# Patient Record
Sex: Female | Born: 1935 | Race: White | Hispanic: No | Marital: Married | Smoking: Current every day smoker
Health system: Southern US, Community
[De-identification: ages and names within clinical notes are randomized; demographics above are authoritative.]

## PROBLEM LIST (undated history)

## (undated) DIAGNOSIS — M545 Low back pain, unspecified: Secondary | ICD-10-CM

## (undated) DIAGNOSIS — G8929 Other chronic pain: Secondary | ICD-10-CM

## (undated) DIAGNOSIS — N289 Disorder of kidney and ureter, unspecified: Secondary | ICD-10-CM

## (undated) DIAGNOSIS — E119 Type 2 diabetes mellitus without complications: Secondary | ICD-10-CM

## (undated) HISTORY — PX: JOINT REPLACEMENT: SHX530

## (undated) HISTORY — PX: TONSILLECTOMY: SUR1361

---

## 2004-11-29 ENCOUNTER — Ambulatory Visit: Payer: Self-pay

## 2006-01-25 ENCOUNTER — Ambulatory Visit: Payer: Self-pay | Admitting: Family Medicine

## 2007-01-29 ENCOUNTER — Ambulatory Visit: Payer: Self-pay | Admitting: Family Medicine

## 2007-06-19 ENCOUNTER — Ambulatory Visit: Payer: Self-pay | Admitting: Nephrology

## 2007-09-12 ENCOUNTER — Ambulatory Visit: Payer: Self-pay | Admitting: Family Medicine

## 2007-09-17 ENCOUNTER — Ambulatory Visit: Payer: Self-pay | Admitting: Family Medicine

## 2007-11-25 ENCOUNTER — Inpatient Hospital Stay: Payer: Self-pay | Admitting: Internal Medicine

## 2007-11-29 ENCOUNTER — Encounter: Payer: Self-pay | Admitting: Internal Medicine

## 2009-02-03 ENCOUNTER — Ambulatory Visit: Payer: Self-pay | Admitting: Internal Medicine

## 2009-12-21 ENCOUNTER — Emergency Department: Payer: Self-pay | Admitting: Emergency Medicine

## 2009-12-24 ENCOUNTER — Emergency Department: Payer: Self-pay | Admitting: Emergency Medicine

## 2010-01-04 ENCOUNTER — Inpatient Hospital Stay: Payer: Self-pay | Admitting: General Practice

## 2010-04-20 ENCOUNTER — Emergency Department: Payer: Self-pay | Admitting: Emergency Medicine

## 2010-04-21 ENCOUNTER — Emergency Department: Payer: Self-pay | Admitting: Internal Medicine

## 2010-05-17 ENCOUNTER — Inpatient Hospital Stay: Payer: Self-pay | Admitting: Specialist

## 2010-05-20 ENCOUNTER — Encounter: Payer: Self-pay | Admitting: Internal Medicine

## 2010-05-27 ENCOUNTER — Ambulatory Visit: Payer: Self-pay | Admitting: Internal Medicine

## 2010-05-29 ENCOUNTER — Emergency Department: Payer: Self-pay | Admitting: Emergency Medicine

## 2010-05-30 ENCOUNTER — Encounter: Payer: Self-pay | Admitting: Internal Medicine

## 2010-06-03 ENCOUNTER — Ambulatory Visit: Payer: Self-pay | Admitting: Internal Medicine

## 2010-06-08 ENCOUNTER — Other Ambulatory Visit: Payer: Self-pay | Admitting: Orthopedic Surgery

## 2010-06-08 DIAGNOSIS — M545 Low back pain: Secondary | ICD-10-CM

## 2010-06-08 DIAGNOSIS — M4800 Spinal stenosis, site unspecified: Secondary | ICD-10-CM

## 2010-06-08 DIAGNOSIS — M79605 Pain in left leg: Secondary | ICD-10-CM

## 2010-06-09 ENCOUNTER — Ambulatory Visit
Admission: RE | Admit: 2010-06-09 | Discharge: 2010-06-09 | Disposition: A | Discharge status: Home / Self Care | Payer: Medicare Other | Source: Ambulatory Visit | Attending: Orthopedic Surgery | Admitting: Orthopedic Surgery

## 2010-06-09 ENCOUNTER — Other Ambulatory Visit: Payer: Self-pay | Admitting: Orthopedic Surgery

## 2010-06-09 ENCOUNTER — Ambulatory Visit: Payer: Medicare Other

## 2010-06-09 DIAGNOSIS — M79605 Pain in left leg: Secondary | ICD-10-CM

## 2010-06-09 DIAGNOSIS — M4800 Spinal stenosis, site unspecified: Secondary | ICD-10-CM

## 2010-06-09 DIAGNOSIS — M545 Low back pain: Secondary | ICD-10-CM

## 2010-06-13 ENCOUNTER — Inpatient Hospital Stay: Payer: Self-pay | Admitting: Unknown Physician Specialty

## 2010-07-22 ENCOUNTER — Ambulatory Visit: Payer: Self-pay | Admitting: Unknown Physician Specialty

## 2010-07-31 ENCOUNTER — Inpatient Hospital Stay: Payer: Self-pay | Admitting: Internal Medicine

## 2010-12-19 ENCOUNTER — Ambulatory Visit: Payer: Self-pay | Admitting: Ophthalmology

## 2011-01-10 ENCOUNTER — Ambulatory Visit: Payer: Self-pay | Admitting: Neurosurgery

## 2011-12-29 IMAGING — CT CT OF THE RIGHT HIP WITHOUT CONTRAST
1 series · 16 of 32 positions shown, 20 images · non-contrast
Comparison: none

REASON FOR EXAM: pain, non-weight bearing, Hx ipsilateral pelvic fx
COMMENTS:

PROCEDURE:     CT  - CT HIP RIGHT WITHOUT CONTRAST  - December 24, 2009  [DATE]
RESULT:     History: Pain.
Comparison Study: No recent.

[Series 2: hip 3.0 b70s · axial · 0.38mm/px · z∈[-488,-300]mm · 16 of 71 slices shown, 20 images]
[im 5/71  soft-tissue]
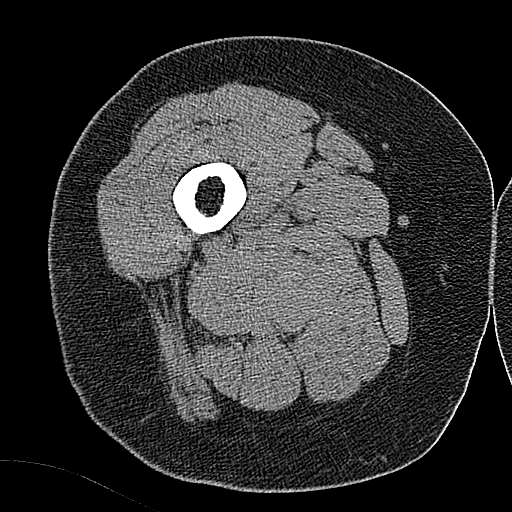
[im 5/71  bone]
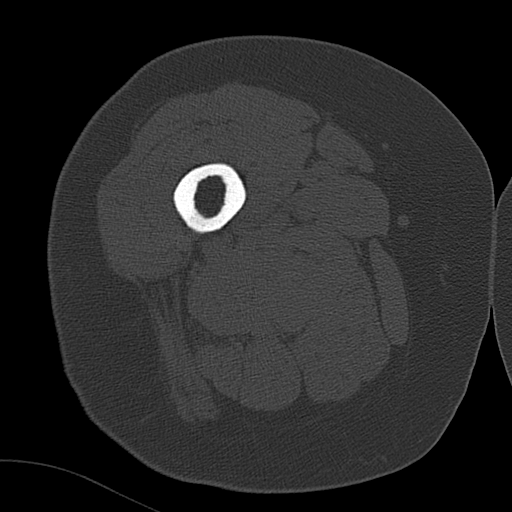
[im 10/71  soft-tissue]
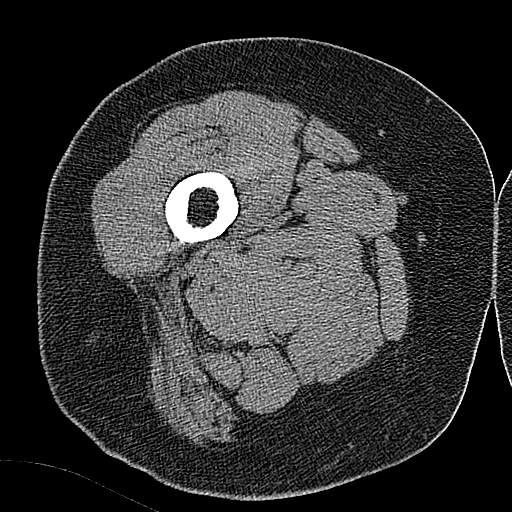
[im 14/71  soft-tissue]
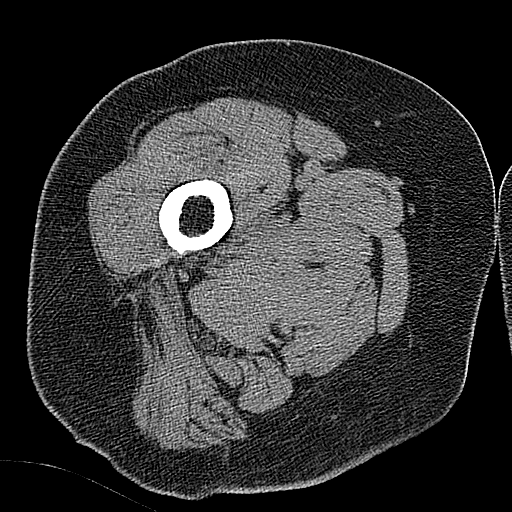
[im 19/71  soft-tissue]
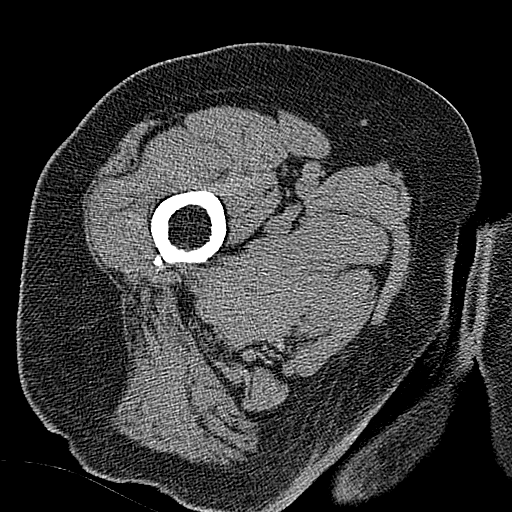
[im 23/71  soft-tissue]
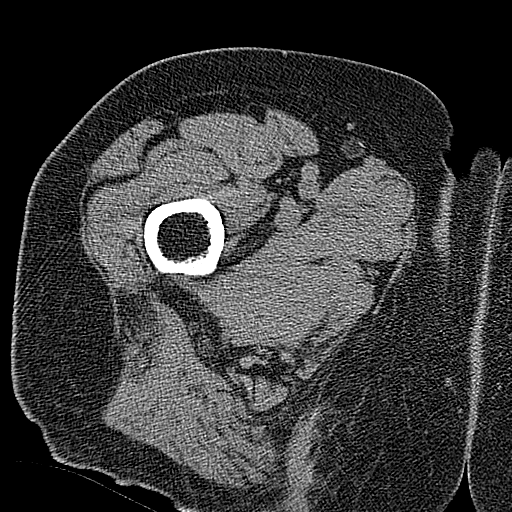
[im 28/71  soft-tissue]
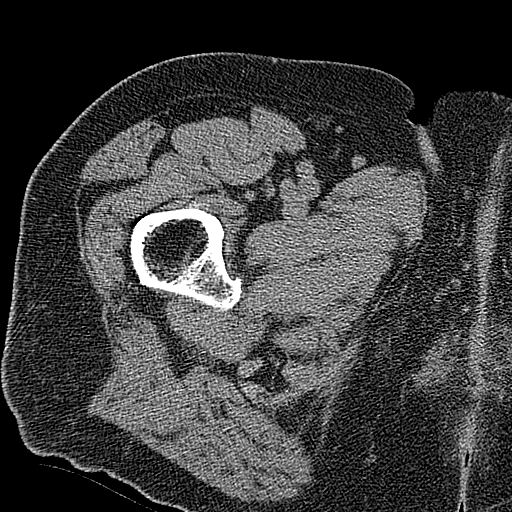
[im 32/71  soft-tissue]
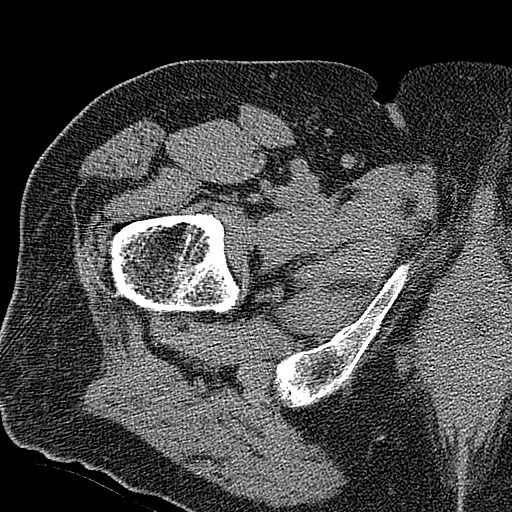
[im 39/71  soft-tissue]
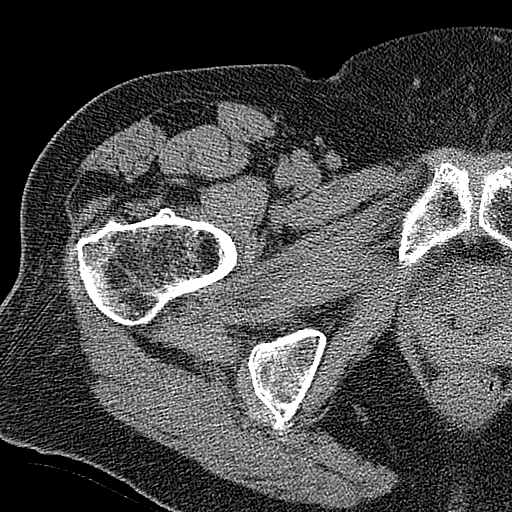
[im 43/71  soft-tissue]
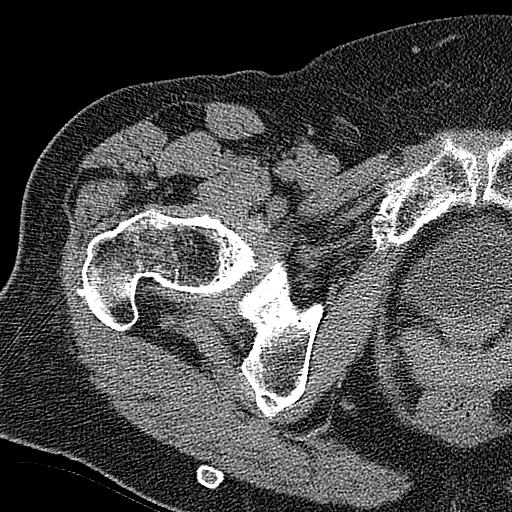
[im 43/71  bone]
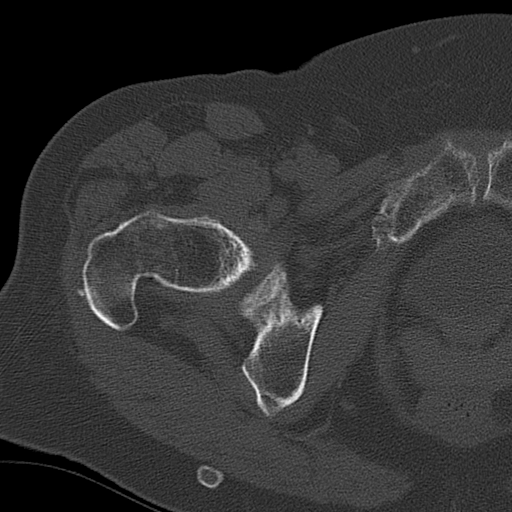
[im 48/71  soft-tissue]
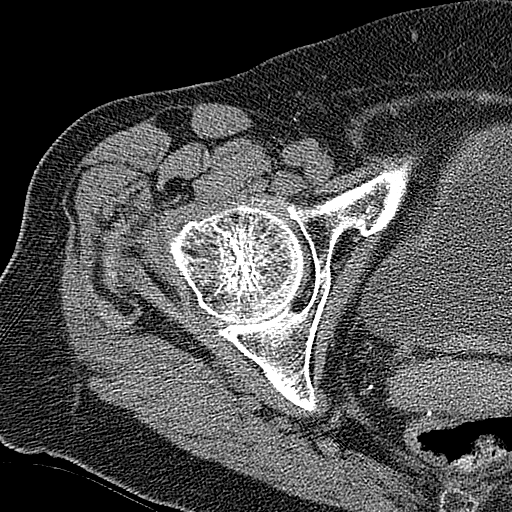
[im 52/71  soft-tissue]
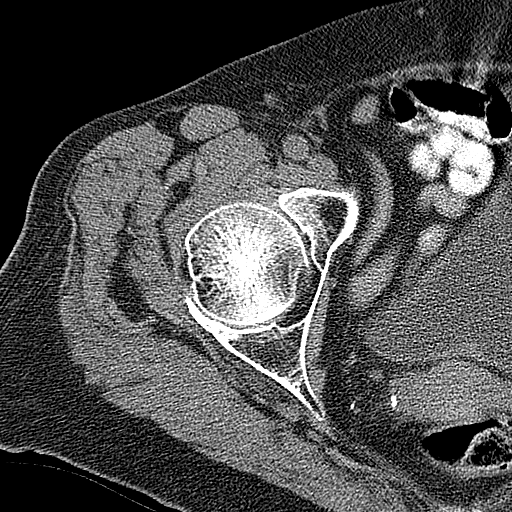
[im 57/71  soft-tissue]
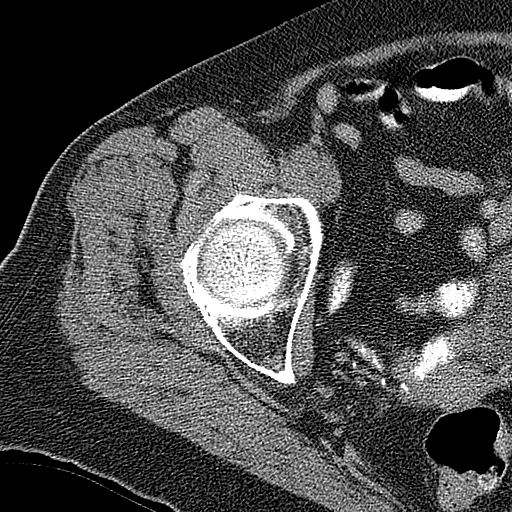
[im 61/71  soft-tissue]
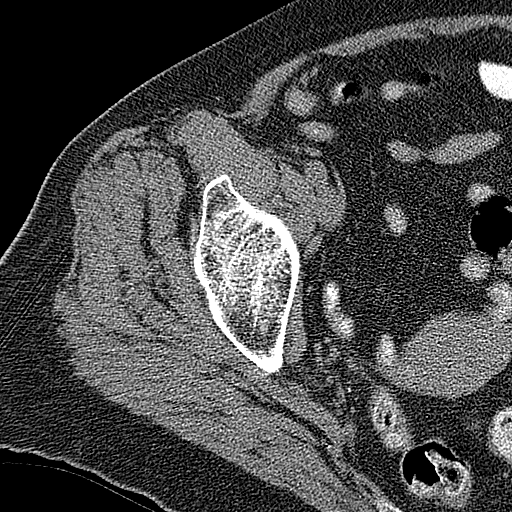
[im 61/71  lung]
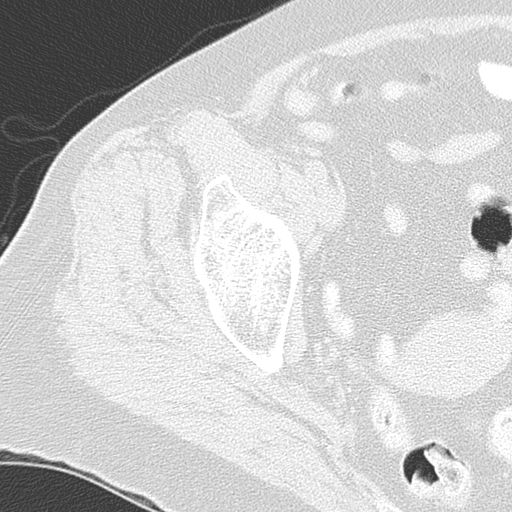
[im 64/71  lung]
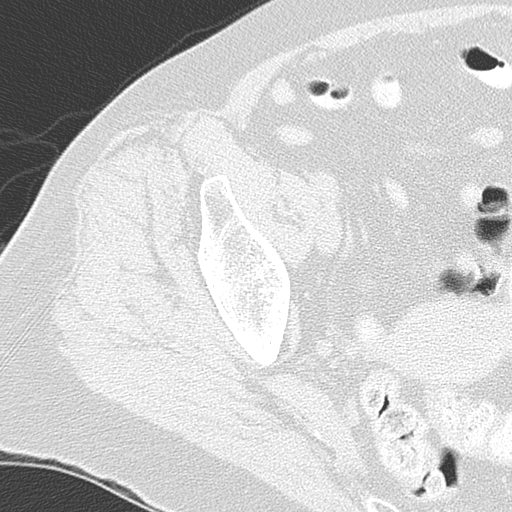
[im 66/71  soft-tissue]
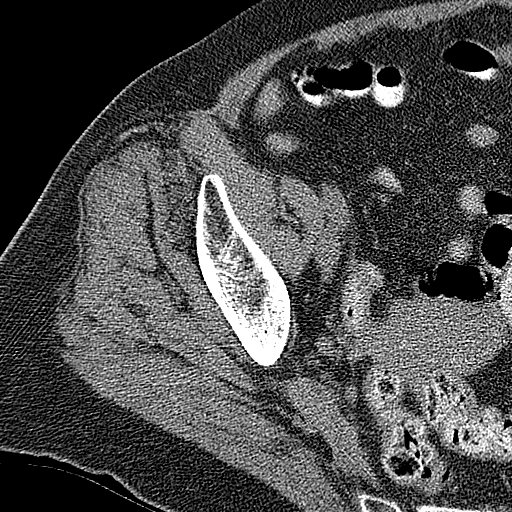
[im 66/71  lung]
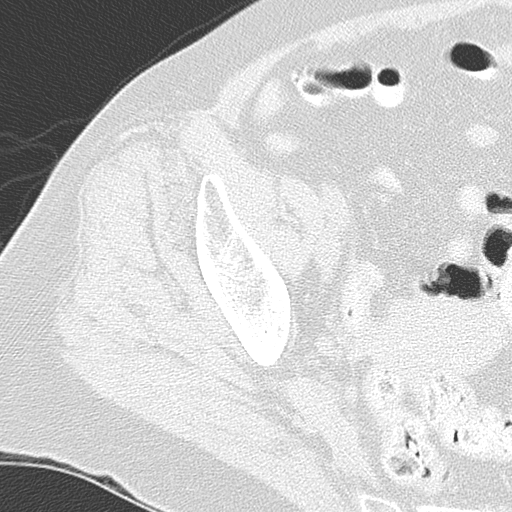
[im 68/71  lung]
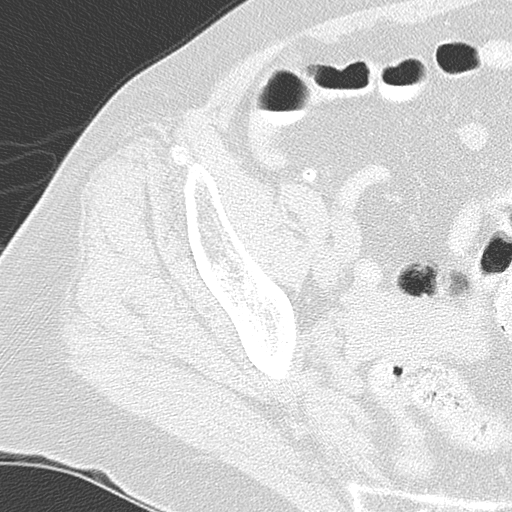

[16 of 32 positions shown; findings below may reference images not displayed]

FINDINGS: Standard CT obtained. Degenerative change present. There is no
evidence of fracture or dislocation.
IMPRESSION: No acute abnormality.

## 2012-02-21 ENCOUNTER — Ambulatory Visit: Payer: Self-pay | Admitting: Gastroenterology

## 2012-02-22 LAB — PATHOLOGY REPORT

## 2012-02-29 ENCOUNTER — Ambulatory Visit: Payer: Self-pay | Admitting: Internal Medicine

## 2012-05-31 IMAGING — CR DG LUMBAR SPINE AP/LAT/OBLIQUES W/ FLEX AND EXT
1 series · 5 of 5 positions shown · non-contrast
Comparison: none

REASON FOR EXAM: Back Pain
COMMENTS:

[Series 1: view not recorded · 0.17mm/px · 5 of 5 slices shown]
[im 1/5]
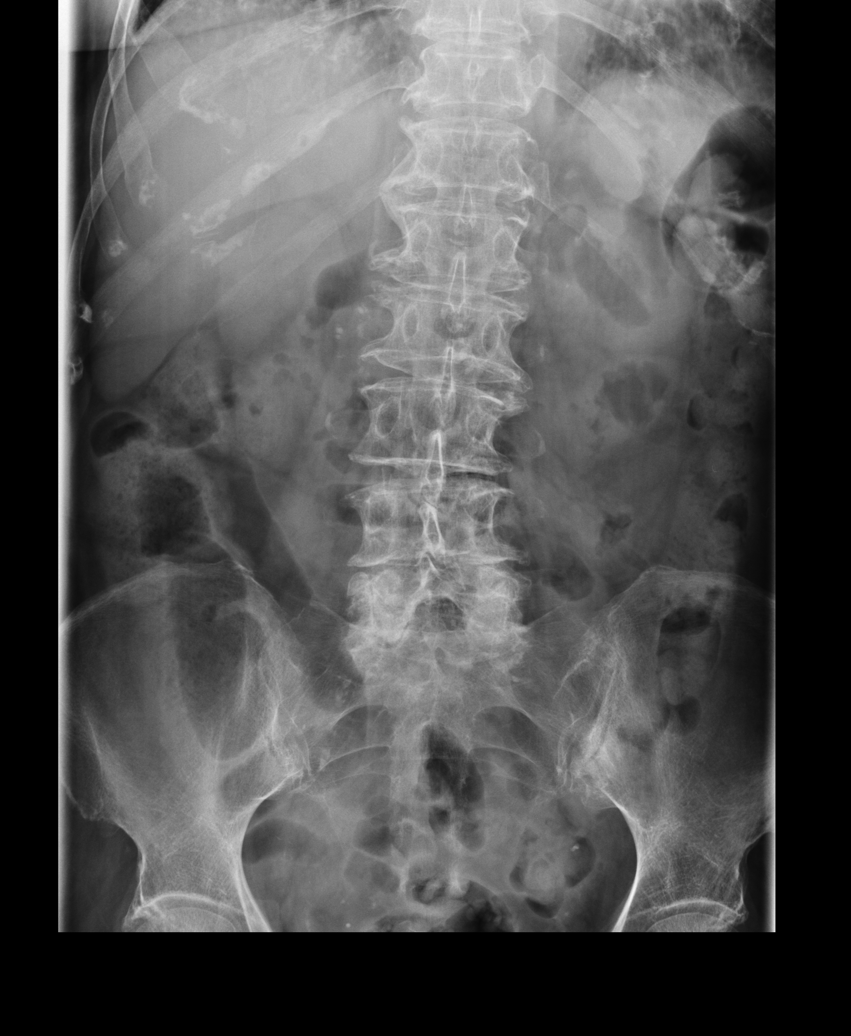
[im 2/5]
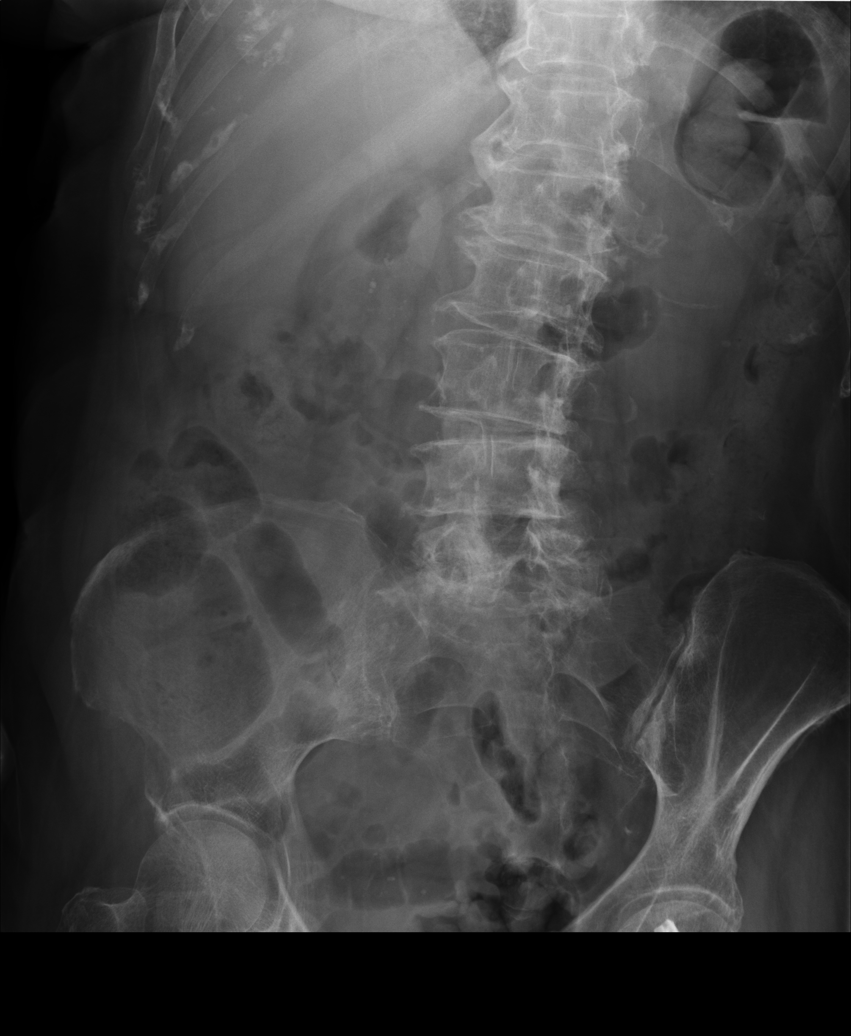
[im 3/5]
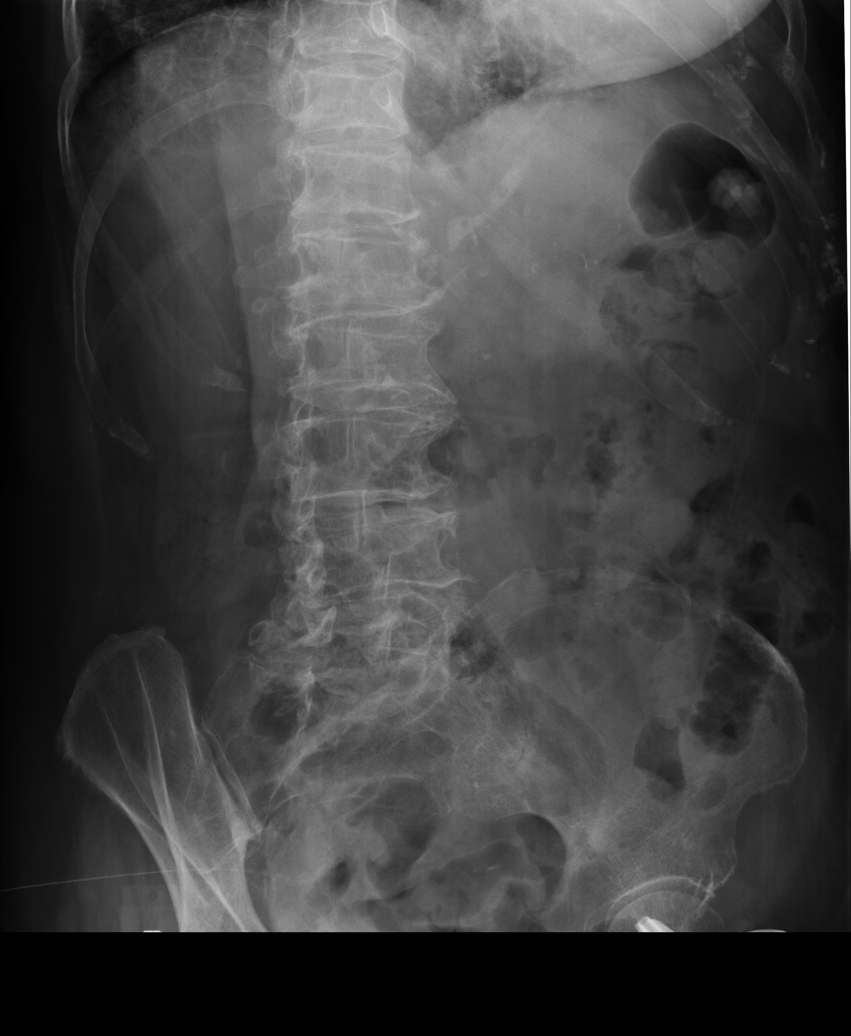
[im 4/5]
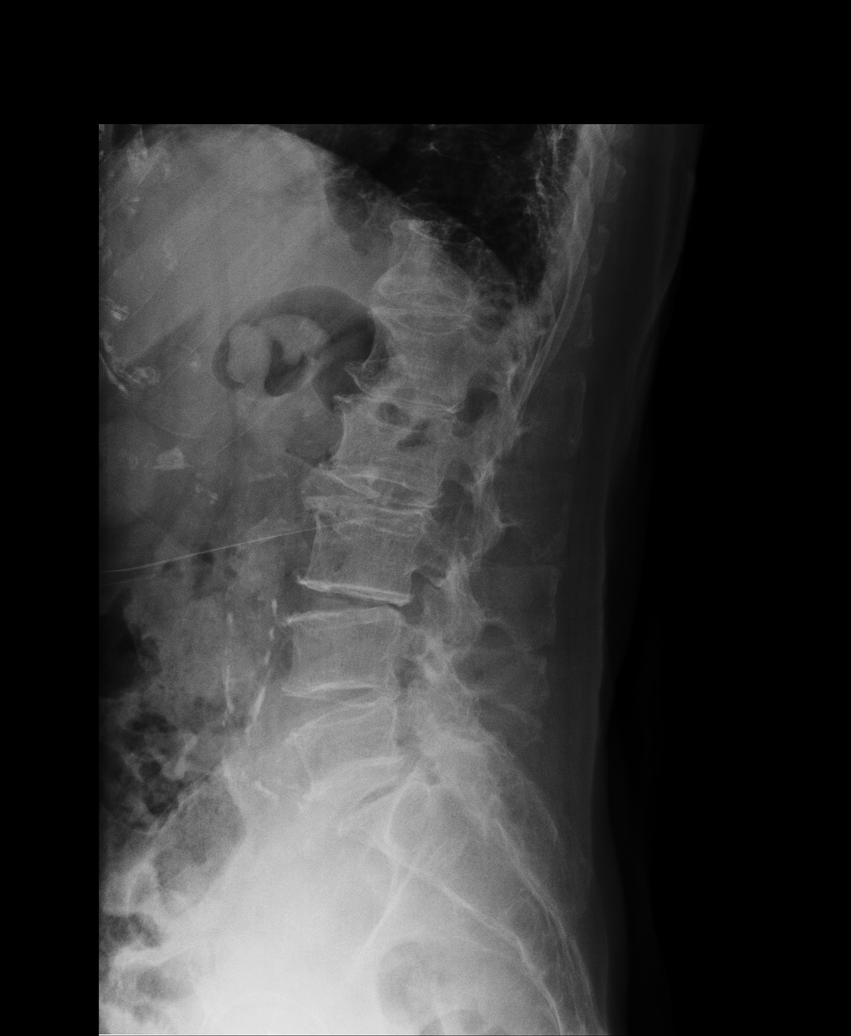
[im 5/5]
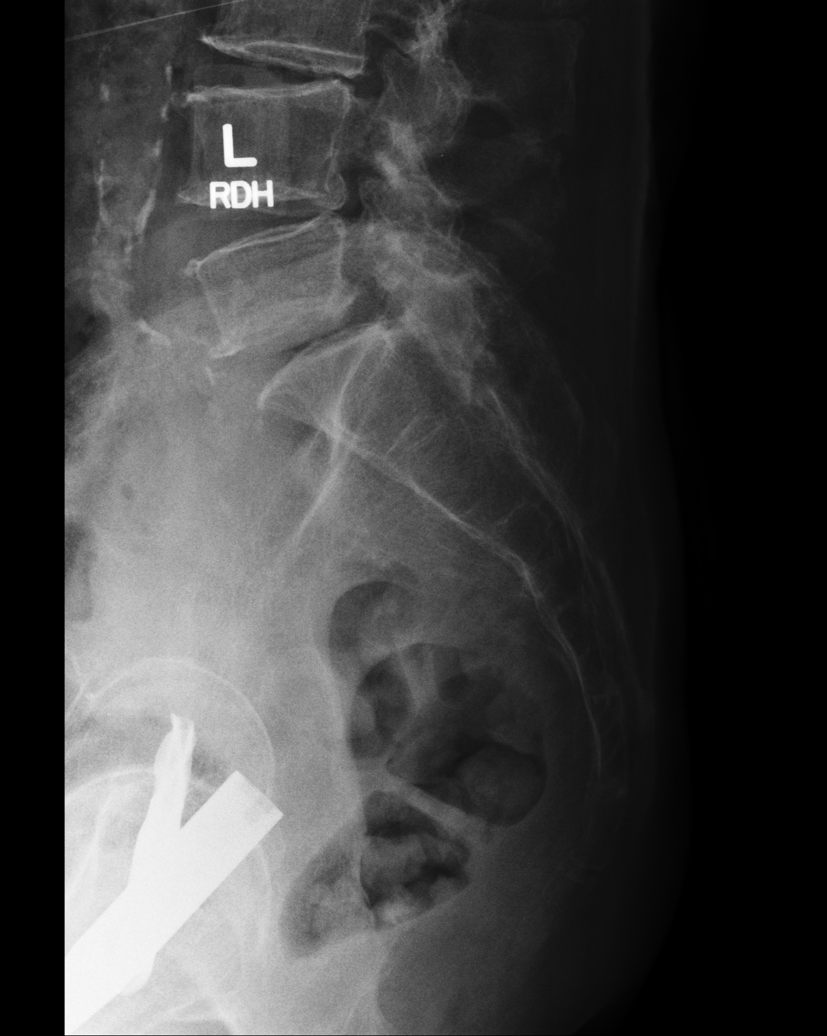

[5 of 5 positions shown; findings below may reference images not displayed]

PROCEDURE:     DXR - DXR LUMBAR SPINE WITH OBLIQUES  - May 27, 2010  [DATE]

RESULT:     The vertebral body heights are well-maintained. No fracture is
seen. Oblique view shows no acute changes of the articular facets. The
vertebral body alignment is normal. There is narrowing of the L3-L4
intervertebral disc space laterally on the left. There is a very slight
lumbar scoliosis with the convexity to the right. The pedicles are
bilaterally intact. Hypertrophic spurring is noted at multiple levels. No
lytic or blastic lesions are seen.
IMPRESSION: 1. No acute bony abnormalities are identified. There is mild narrowing of
the L3-L4 intervertebral disc space laterally on the left. Although not
mentioned above there appears to be slight narrowing of the L4-L5
intervertebral disc space as well.
2. There is a slight lumbar scoliosis.
3. Hypertrophic spurring is present at multiple levels.

## 2012-06-07 IMAGING — CT CT PELVIS W/O CM
1 series · 15 of 32 positions shown, 19 images · non-contrast
Comparison: none

REASON FOR EXAM: STAT CR [REDACTED] ROSE TERRACE AT [facility] NUR[facility]ALLS OR NURSE SHAZO SENTU...
COMMENTS:

[Series 2: bone windows · axial · 0.70mm/px · z∈[-1624,-1390]mm · 15 of 87 slices shown, 19 images]
[im 6/87  soft-tissue]
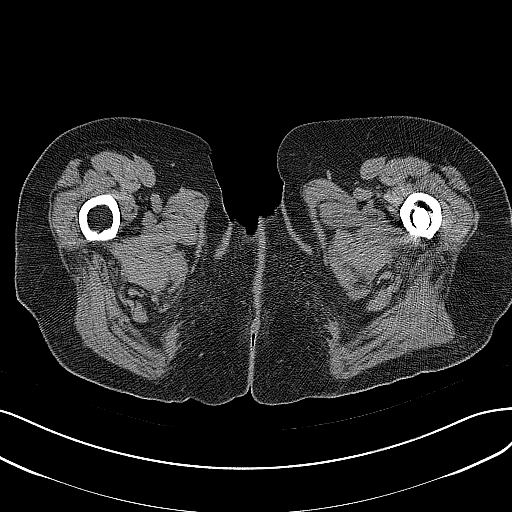
[im 6/87  bone]
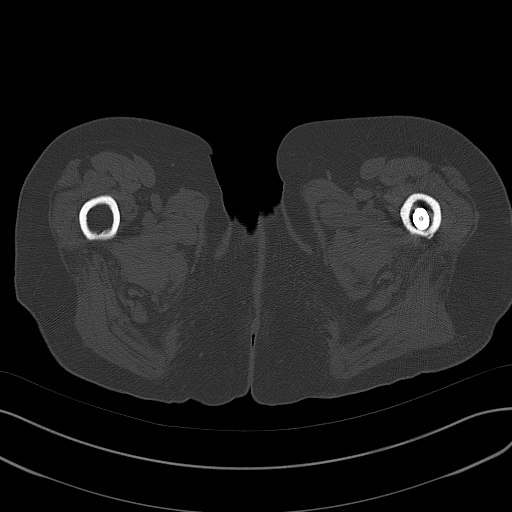
[im 12/87  soft-tissue]
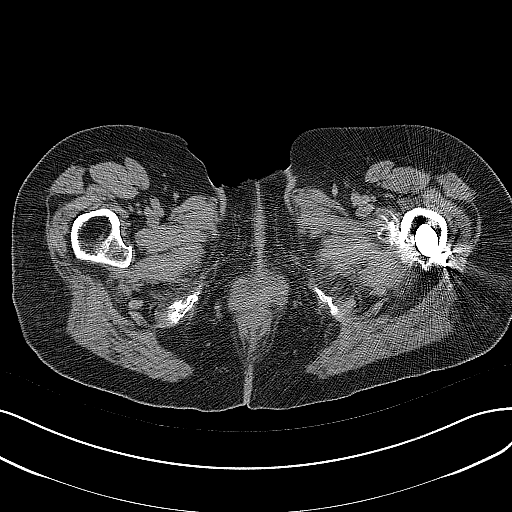
[im 17/87  soft-tissue]
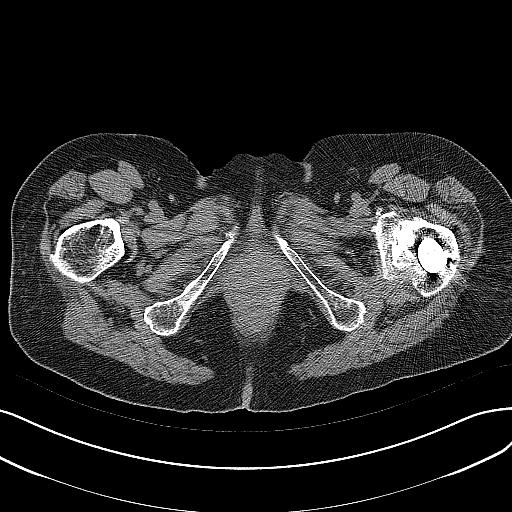
[im 25/87  soft-tissue]
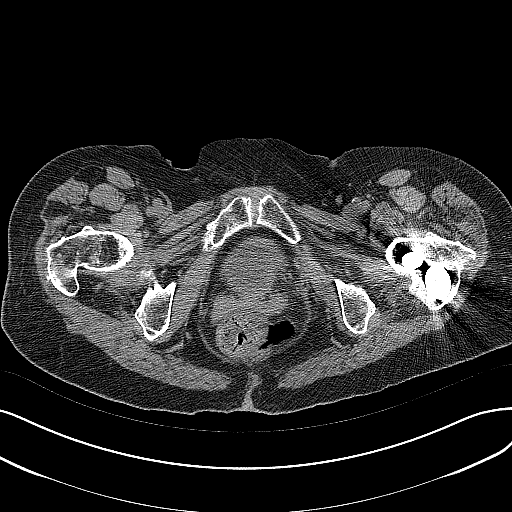
[im 31/87  soft-tissue]
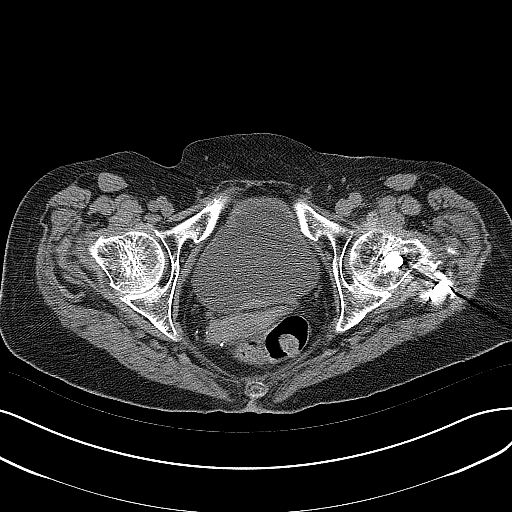
[im 37/87  soft-tissue]
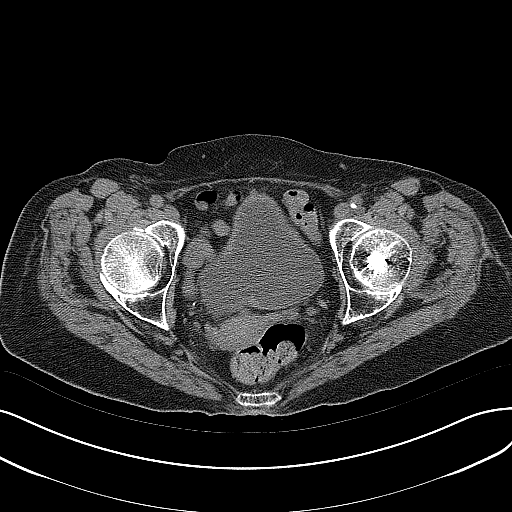
[im 45/87  soft-tissue]
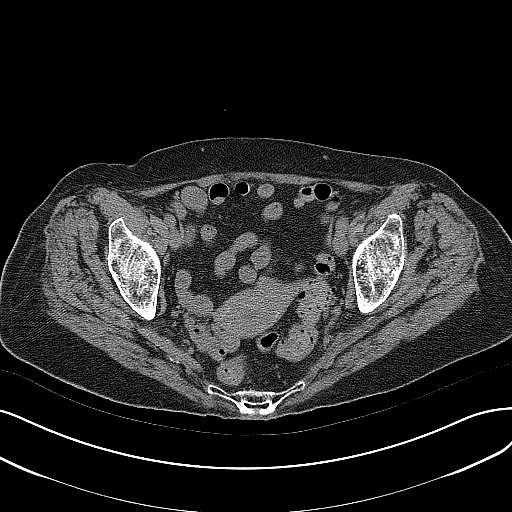
[im 50/87  soft-tissue]
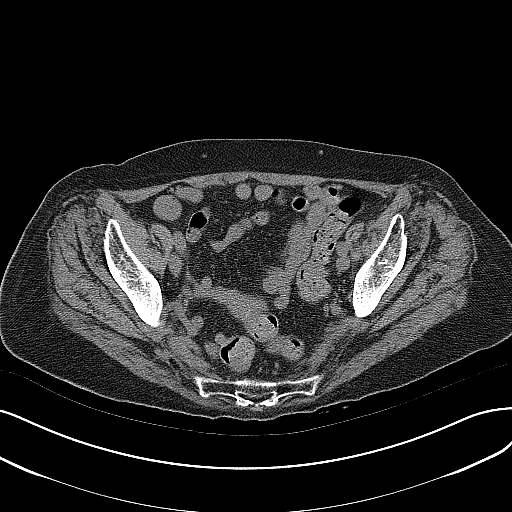
[im 56/87  soft-tissue]
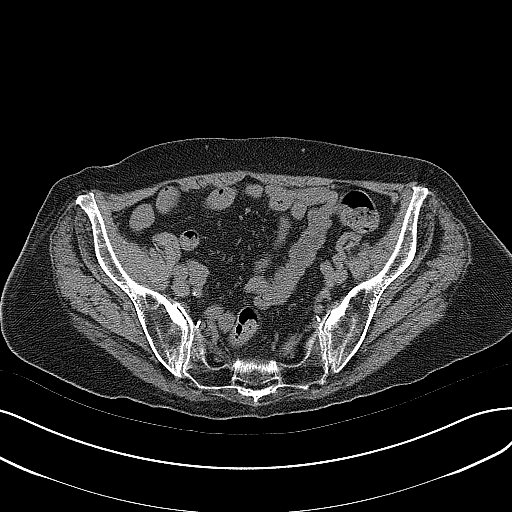
[im 56/87  bone]
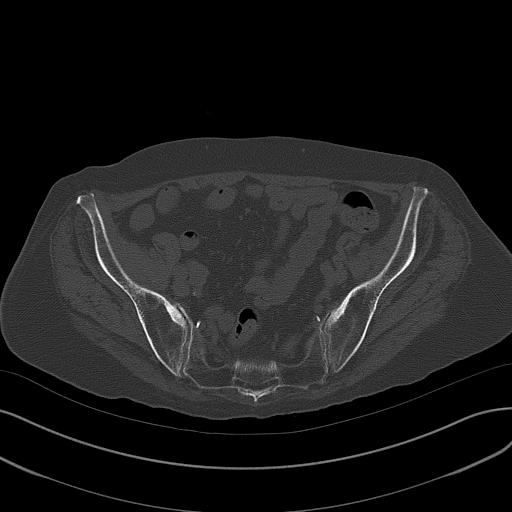
[im 62/87  soft-tissue]
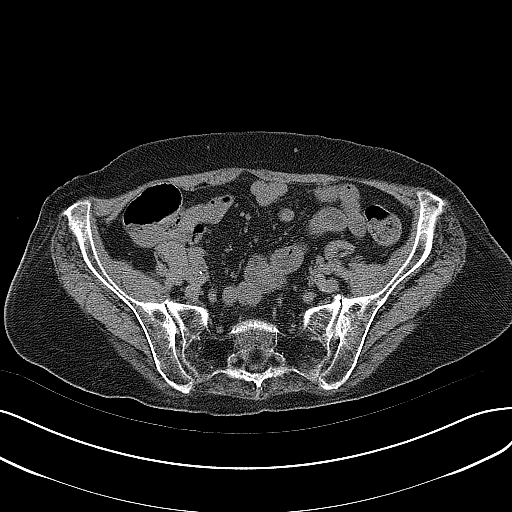
[im 70/87  soft-tissue]
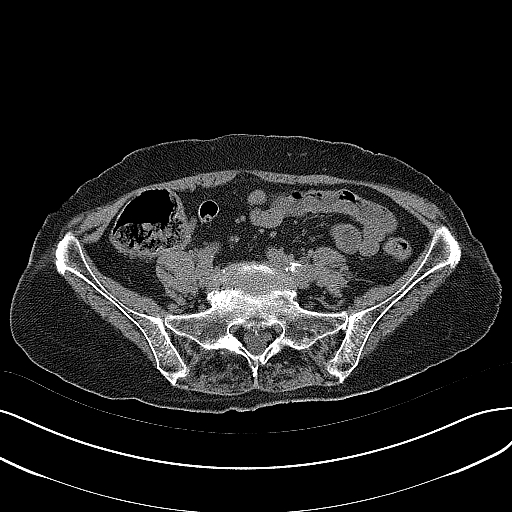
[im 75/87  soft-tissue]
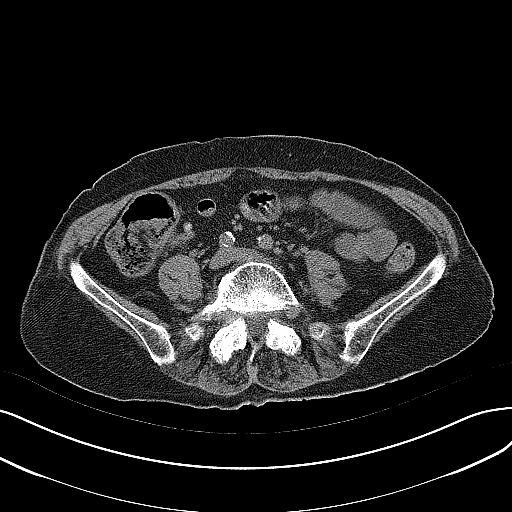
[im 75/87  lung]
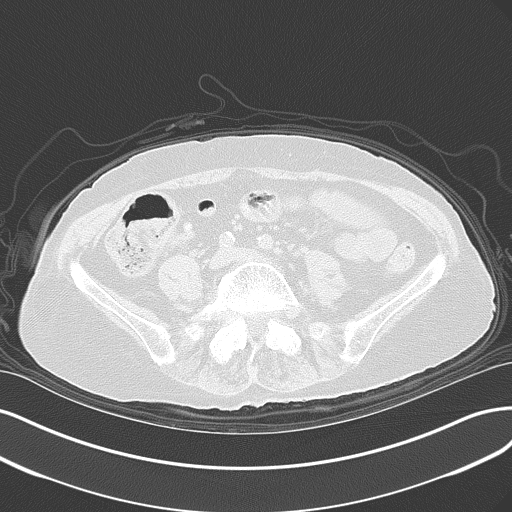
[im 78/87  lung]
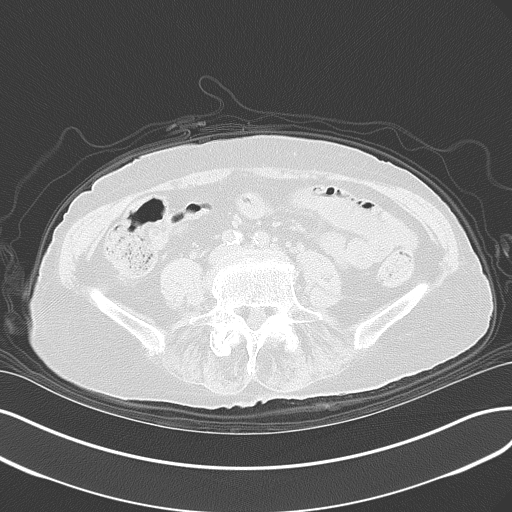
[im 81/87  soft-tissue]
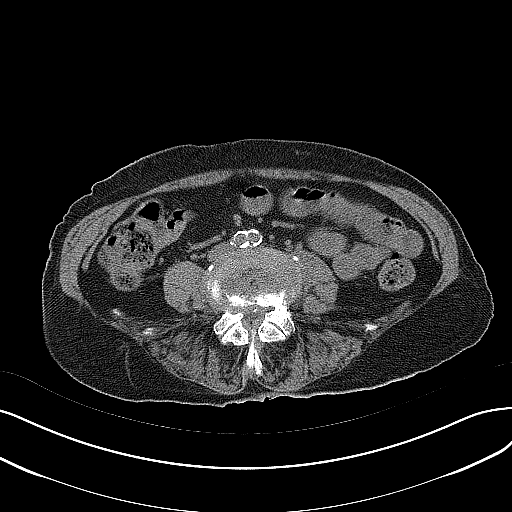
[im 81/87  lung]
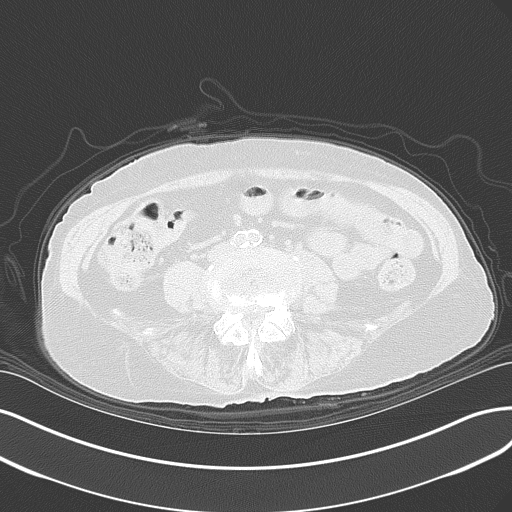
[im 84/87  lung]
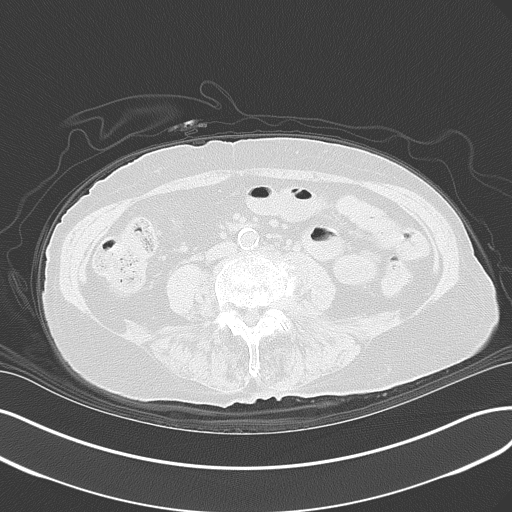

[15 of 32 positions shown; findings below may reference images not displayed]

PROCEDURE:     CT  - CT PELVIS STANDARD WO  - June 03, 2010  [DATE]

RESULT:     Multislice helical acquisition through the pelvis is
reconstructed at bone window settings and 3 mm slice thickness increments
with multiplanar and three-dimensional reconstructions performed utilizing
the [employer] software. There is no previous similar exam for comparison.

Degenerative changes are seen in the sacroiliac joints. The sacrum appears
intact. The pubic symphysis and pubic bones appear unremarkable. The iliac
bones are intact. The acetabular regions appear normal. There is artifact
from the intramedullary rod in the left femur. There does appear to be some
cortical disruption and cortical thinning about the posterior aspect of the
proximal femur. Some of this could be artifact from the metallic rod in that
region. The patient has a known recent fracture. Correlate clinically.
IMPRESSION: No acute bony abnormality evident within the pelvis.
Intramedullary rod in the femur with some cortical discontinuity in the
posterior aspect of the proximal femoral shaft of the left leg which could
be secondary to the recent injury necessitating the intramedullary rod
placement. Orthopedic followup for that would be suggested.

## 2013-05-24 LAB — CBC
HCT: 42.1 %
HGB: 14 g/dL
MCH: 30.7 pg
MCHC: 33.2 g/dL
MCV: 93 fL
Platelet: 181 x10 3/mm 3
RBC: 4.54 X10 6/mm 3
RDW: 13.3 %
WBC: 8.1 x10 3/mm 3

## 2013-05-24 LAB — COMPREHENSIVE METABOLIC PANEL
ALK PHOS: 87 U/L
ALT: 19 U/L (ref 12–78)
ANION GAP: 6 — AB (ref 7–16)
Albumin: 2.9 g/dL — ABNORMAL LOW (ref 3.4–5.0)
BILIRUBIN TOTAL: 0.6 mg/dL (ref 0.2–1.0)
BUN: 53 mg/dL — ABNORMAL HIGH (ref 7–18)
CHLORIDE: 92 mmol/L — AB (ref 98–107)
CO2: 23 mmol/L (ref 21–32)
Calcium, Total: 8.4 mg/dL — ABNORMAL LOW (ref 8.5–10.1)
Creatinine: 2.03 mg/dL — ABNORMAL HIGH (ref 0.60–1.30)
GFR CALC AF AMER: 26 — AB
GFR CALC NON AF AMER: 23 — AB
GLUCOSE: 475 mg/dL — AB (ref 65–99)
Osmolality: 279 (ref 275–301)
Potassium: 4.9 mmol/L (ref 3.5–5.1)
SGOT(AST): 42 U/L — ABNORMAL HIGH (ref 15–37)
Sodium: 121 mmol/L — ABNORMAL LOW (ref 136–145)
Total Protein: 7.4 g/dL (ref 6.4–8.2)

## 2013-05-24 LAB — URINALYSIS, COMPLETE
Bacteria: NONE SEEN
Bilirubin,UR: NEGATIVE
Glucose,UR: >=500 mg/dL
Ketone: NEGATIVE
Nitrite: NEGATIVE
Ph: 5
Protein: NEGATIVE
RBC,UR: 1 /HPF
Specific Gravity: 1.01
Squamous Epithelial: 1
WBC UR: 1 /HPF

## 2013-05-24 LAB — TROPONIN I: Troponin-I: <0.02 ng/mL

## 2013-05-25 ENCOUNTER — Inpatient Hospital Stay: Payer: Self-pay | Admitting: Specialist

## 2013-05-25 LAB — BASIC METABOLIC PANEL WITH GFR
Anion Gap: 6 — ABNORMAL LOW
BUN: 47 mg/dL — ABNORMAL HIGH
Calcium, Total: 7.7 mg/dL — ABNORMAL LOW
Chloride: 106 mmol/L
Co2: 23 mmol/L
Creatinine: 1.74 mg/dL — ABNORMAL HIGH
EGFR (African American): 32 — ABNORMAL LOW
EGFR (Non-African Amer.): 27 — ABNORMAL LOW
Glucose: 244 mg/dL — ABNORMAL HIGH
Osmolality: 290
Potassium: 4 mmol/L
Sodium: 135 mmol/L — ABNORMAL LOW

## 2013-05-26 LAB — CBC WITH DIFFERENTIAL/PLATELET
Basophil #: 0 10*3/uL (ref 0.0–0.1)
Basophil %: 0.5 %
EOS PCT: 0.8 %
Eosinophil #: 0.1 10*3/uL (ref 0.0–0.7)
HCT: 38.1 % (ref 35.0–47.0)
HGB: 12.5 g/dL (ref 12.0–16.0)
LYMPHS PCT: 41.4 %
Lymphocyte #: 2.5 10*3/uL (ref 1.0–3.6)
MCH: 30.4 pg (ref 26.0–34.0)
MCHC: 32.8 g/dL (ref 32.0–36.0)
MCV: 93 fL (ref 80–100)
Monocyte #: 0.6 x10 3/mm (ref 0.2–0.9)
Monocyte %: 10.5 %
Neutrophil #: 2.8 10*3/uL (ref 1.4–6.5)
Neutrophil %: 46.8 %
PLATELETS: 175 10*3/uL (ref 150–440)
RBC: 4.11 10*6/uL (ref 3.80–5.20)
RDW: 13.5 % (ref 11.5–14.5)
WBC: 6 10*3/uL (ref 3.6–11.0)

## 2013-05-26 LAB — HEMOGLOBIN A1C: Hemoglobin A1C: 13.6 % — ABNORMAL HIGH (ref 4.2–6.3)

## 2013-05-26 LAB — BASIC METABOLIC PANEL
Anion Gap: 4 — ABNORMAL LOW (ref 7–16)
BUN: 36 mg/dL — AB (ref 7–18)
CALCIUM: 8.5 mg/dL (ref 8.5–10.1)
CHLORIDE: 112 mmol/L — AB (ref 98–107)
CO2: 22 mmol/L (ref 21–32)
Creatinine: 1.37 mg/dL — ABNORMAL HIGH (ref 0.60–1.30)
GFR CALC AF AMER: 42 — AB
GFR CALC NON AF AMER: 36 — AB
GLUCOSE: 140 mg/dL — AB (ref 65–99)
OSMOLALITY: 286 (ref 275–301)
POTASSIUM: 3.9 mmol/L (ref 3.5–5.1)
SODIUM: 138 mmol/L (ref 136–145)

## 2013-06-15 ENCOUNTER — Emergency Department: Payer: Self-pay | Admitting: Emergency Medicine

## 2013-06-15 LAB — CBC WITH DIFFERENTIAL/PLATELET
Basophil #: 0.1 10*3/uL (ref 0.0–0.1)
Basophil %: 0.7 %
Eosinophil #: 0.1 10*3/uL (ref 0.0–0.7)
Eosinophil %: 1.5 %
HCT: 38.4 % (ref 35.0–47.0)
HGB: 12.4 g/dL (ref 12.0–16.0)
LYMPHS ABS: 2 10*3/uL (ref 1.0–3.6)
Lymphocyte %: 24.4 %
MCH: 30 pg (ref 26.0–34.0)
MCHC: 32.2 g/dL (ref 32.0–36.0)
MCV: 93 fL (ref 80–100)
MONO ABS: 0.8 x10 3/mm (ref 0.2–0.9)
Monocyte %: 9.8 %
Neutrophil #: 5.2 10*3/uL (ref 1.4–6.5)
Neutrophil %: 63.6 %
Platelet: 243 10*3/uL (ref 150–440)
RBC: 4.12 10*6/uL (ref 3.80–5.20)
RDW: 14.1 % (ref 11.5–14.5)
WBC: 8.2 10*3/uL (ref 3.6–11.0)

## 2013-06-15 LAB — BASIC METABOLIC PANEL
ANION GAP: 5 — AB (ref 7–16)
BUN: 19 mg/dL — ABNORMAL HIGH (ref 7–18)
CALCIUM: 9.4 mg/dL (ref 8.5–10.1)
Chloride: 102 mmol/L (ref 98–107)
Co2: 27 mmol/L (ref 21–32)
Creatinine: 1.34 mg/dL — ABNORMAL HIGH (ref 0.60–1.30)
EGFR (African American): 43 — ABNORMAL LOW
GFR CALC NON AF AMER: 37 — AB
Glucose: 189 mg/dL — ABNORMAL HIGH (ref 65–99)
OSMOLALITY: 276 (ref 275–301)
Potassium: 4.1 mmol/L (ref 3.5–5.1)
SODIUM: 134 mmol/L — AB (ref 136–145)

## 2013-06-15 LAB — TROPONIN I: Troponin-I: <0.02 ng/mL

## 2013-06-16 LAB — HEPATIC FUNCTION PANEL A (ARMC)
ALK PHOS: 134 U/L — AB
AST: 25 U/L (ref 15–37)
Albumin: 3.6 g/dL (ref 3.4–5.0)
Bilirubin, Direct: 0.1 mg/dL (ref 0.00–0.20)
Bilirubin,Total: 0.4 mg/dL (ref 0.2–1.0)
SGPT (ALT): 20 U/L (ref 12–78)
Total Protein: 6.8 g/dL (ref 6.4–8.2)

## 2013-06-16 LAB — URINALYSIS, COMPLETE
Bilirubin,UR: NEGATIVE
KETONE: NEGATIVE
Nitrite: NEGATIVE
Ph: 6 (ref 4.5–8.0)
Protein: NEGATIVE
RBC,UR: 10 /HPF (ref 0–5)
Specific Gravity: 1.008 (ref 1.003–1.030)
WBC UR: 1 /HPF (ref 0–5)

## 2013-06-16 LAB — LIPASE, BLOOD: Lipase: 151 U/L (ref 73–393)

## 2013-06-17 ENCOUNTER — Emergency Department: Payer: Self-pay | Admitting: Emergency Medicine

## 2013-06-17 LAB — COMPREHENSIVE METABOLIC PANEL
ALBUMIN: 3.1 g/dL — AB (ref 3.4–5.0)
ALT: 15 U/L (ref 12–78)
AST: 24 U/L (ref 15–37)
Alkaline Phosphatase: 134 U/L — ABNORMAL HIGH
Anion Gap: 6 — ABNORMAL LOW (ref 7–16)
BUN: 21 mg/dL — ABNORMAL HIGH (ref 7–18)
Bilirubin,Total: 0.5 mg/dL (ref 0.2–1.0)
CALCIUM: 9.1 mg/dL (ref 8.5–10.1)
CHLORIDE: 103 mmol/L (ref 98–107)
Co2: 28 mmol/L (ref 21–32)
Creatinine: 1.63 mg/dL — ABNORMAL HIGH (ref 0.60–1.30)
EGFR (African American): 34 — ABNORMAL LOW
EGFR (Non-African Amer.): 29 — ABNORMAL LOW
Glucose: 237 mg/dL — ABNORMAL HIGH (ref 65–99)
Osmolality: 284 (ref 275–301)
POTASSIUM: 4.7 mmol/L (ref 3.5–5.1)
Sodium: 137 mmol/L (ref 136–145)
Total Protein: 7 g/dL (ref 6.4–8.2)

## 2013-06-17 LAB — CBC
HCT: 36.8 % (ref 35.0–47.0)
HGB: 11.7 g/dL — ABNORMAL LOW (ref 12.0–16.0)
MCH: 29.8 pg (ref 26.0–34.0)
MCHC: 31.8 g/dL — ABNORMAL LOW (ref 32.0–36.0)
MCV: 94 fL (ref 80–100)
PLATELETS: 229 10*3/uL (ref 150–440)
RBC: 3.93 10*6/uL (ref 3.80–5.20)
RDW: 13.8 % (ref 11.5–14.5)
WBC: 8.7 10*3/uL (ref 3.6–11.0)

## 2013-06-17 LAB — URINALYSIS, COMPLETE
BACTERIA: NONE SEEN
Bilirubin,UR: NEGATIVE
Glucose,UR: >=500 mg/dL (ref 0–75)
KETONE: NEGATIVE
Leukocyte Esterase: NEGATIVE
NITRITE: NEGATIVE
Ph: 6 (ref 4.5–8.0)
Protein: NEGATIVE
RBC,UR: 17 /HPF (ref 0–5)
Specific Gravity: 1.005 (ref 1.003–1.030)
Squamous Epithelial: <1

## 2013-06-17 LAB — LIPASE, BLOOD: Lipase: 151 U/L (ref 73–393)

## 2013-06-17 LAB — URINE CULTURE

## 2013-06-19 LAB — URINE CULTURE

## 2014-05-14 ENCOUNTER — Emergency Department: Payer: Self-pay | Admitting: Emergency Medicine

## 2014-05-14 LAB — BASIC METABOLIC PANEL
Anion Gap: 8 (ref 7–16)
BUN: 29 mg/dL — ABNORMAL HIGH (ref 7–18)
CALCIUM: 9 mg/dL (ref 8.5–10.1)
CHLORIDE: 105 mmol/L (ref 98–107)
CO2: 24 mmol/L (ref 21–32)
Creatinine: 1.56 mg/dL — ABNORMAL HIGH (ref 0.60–1.30)
EGFR (African American): 41 — ABNORMAL LOW
EGFR (Non-African Amer.): 34 — ABNORMAL LOW
Glucose: 191 mg/dL — ABNORMAL HIGH (ref 65–99)
OSMOLALITY: 285 (ref 275–301)
POTASSIUM: 5.4 mmol/L — AB (ref 3.5–5.1)
Sodium: 137 mmol/L (ref 136–145)

## 2014-08-08 ENCOUNTER — Emergency Department: Admit: 2014-08-08 | Disposition: A | Discharge status: Home / Self Care | Payer: Self-pay | Admitting: Family Medicine

## 2014-08-15 NOTE — Discharge Summary (Signed)
PATIENT NAME:  Tasha Grant, Tasha L MR#:  [id number] DATE OF BIRTH:  26-Mar-1931  DATE OF ADMISSION:  05/25/2013 DATE OF DISCHARGE:  05/26/2013  For a detailed note, please take a look at the history and physical done on admission by Dr. Randol KernElgergawy.   DIAGNOSES AT DISCHARGE:  1. Uncontrolled diabetes, likely secondary to medical noncompliance.  2. Acute renal failure.  3. Hyponatremia, likely pseudohyponatremia.  4. Hypertension.  5. Hyperlipidemia.  6. Hypothyroidism.  7. Dementia.  8. Diabetic neuropathy.   DIET: The patient is being discharged on a low-sodium, low-fat, carbohydrate-controlled diet.   ACTIVITY: As tolerated.   FOLLOWUP: With Dr. Fulton MoleHarriett Burns in the next 1 to 2 weeks.   HOME HEALTH: The patient is being discharged with home health nursing services.   DISCHARGE MEDICATIONS:  1. Atorvastatin 20 mg daily.  2. Aricept 5 mg at bedtime. 3. Actos 45 mg daily.  4. Gabapentin 100 mg t.i.d. 5. Januvia 50 mg daily. 6. Synthroid 75 mcg daily.  7. Losartan 25 mg daily.  8. Metoprolol tartrate 25 mg b.i.d. 9. Aspirin 81 mg daily. 10. Levemir insulin 10 units at bedtime.   PERTINENT STUDIES DONE DURING THE HOSPITAL COURSE:  A chest x-ray done on admission showing no acute disease.  An x-ray of the lumbar spine showing no lumbar spine fracture, remote L2, L4 compression fracture.  X-ray of the pelvis showing no acute abnormality.   HOSPITAL COURSE: This is an 81 year old female who presented to the hospital on 05/25/2013 secondary to weakness and a fall and noted to be severely hyperglycemic with blood sugars over 500s and noted to be hyponatremic and in acute renal failure.   1. Uncontrolled diabetes. The patient presented to the hospital with blood sugars over 500, which remained persistently elevated. This was likely related to medical noncompliance. The patient's hemoglobin A1c was noted to be as high as 13, although she says she is compliant with her medications. The  patient was place on sliding scale insulin, and her home medications were resumed. On top of her home medications, I did add some low-dose Levemir, which seemed to have improved her sugars. At this point, she will continue Levemir and Januvia and Actos and check her sugars regularly at home. I am arranging home health nursing services to follow her blood sugars and insulin administration at this point. Further titrations to her diabetic medications can be done as an outpatient.  2. Hyponatremia. This was likely pseudohyponatremia secondary to the hyperglycemia. It has since then improved and resolved as her blood sugars have improved.  3. Acute renal failure. This was also secondary to dehydration from her hyperglycemia. After getting IV fluids and her sugars being corrected, her renal function has normalized. Her losartan was held due to her acute renal failure, although it is being resumed upon discharge.  4. Hypothyroidism. The patient was maintained on her Synthroid. She will resume that.  5. Hypertension. The patient remained hemodynamically stable on her metoprolol, which she will continue. Her losartan was held due to acute renal failure, although I am resuming that upon discharge as her renal function has improved.  6. Diabetic neuropathy. The patient was maintained on her Neurontin, and she will resume that upon discharge too.   DISPOSITION: She is being discharged home with home health nursing services.   TIME SPENT: 40 minutes.   ____________________________ Rolly PancakeVivek J. Cherlynn KaiserSainani, MD vjs:lb D: 05/26/2013 17:09:03 ET T: 05/27/2013 06:39:30 ET JOB#: [id number]  cc: Rolly PancakeVivek J. Cherlynn KaiserSainani, MD, <Dictator> Danella DeisVIVEK Jasper LoserJ SAINANI  MD ELECTRONICALLY SIGNED 05/29/2013 11:50

## 2014-08-15 NOTE — H&P (Signed)
PATIENT NAME:  Tasha Grant, Tasha Grant MR#:  [id number] DATE OF BIRTH:  08/17/1930  DATE OF ADMISSION:  05/24/2013  REFERRING PHYSICIAN:  Dr. Janalyn Harderavid Kaminski.   PRIMARY CARE PHYSICIAN:  [facility].   HISTORY OF PRESENT ILLNESS:  This is an 81 year old female with significant past medical history of dementia, diabetes mellitus, hyperlipidemia, hypertension, was [facility] for uncontrolled blood sugar, upon presentation the patient's blood sugar was in the 500s, the patient has dementia, very poor historian, daughter gives most of the history, she reports her mother is compliant with medication, but the patient did not complain of being weak with decreased appetite over the last few days, she usually ambulates at baseline on a wheelchair, only able to do transfer with assistance, the patient had history of fall before two days, where she reports she hit her back, complains of mild lower back pain, but there is no stool or no urinary incontinence or lower extremity weakness, the patient had chest x-ray done of the lumbosacral spine and pelvis, it did not show any acute findings, it did show only evidence of old compression fracture in the lumbar spine, the patient as well is known to have history of chronic kidney disease, baseline fluctuates between 1.5 to 2, creatinine today was at 2.03, as well she had low sodium, even corrected to her hyperglycemia it was still slightly lower as well, most likely related to dehydration and volume depletion, hospitalist service were requested to admit the patient for medical management of her hyperglycemia and chronic kidney disease and hyponatremia.   PAST MEDICAL HISTORY: 1.  Hypothyroidism.  2.  Dementia.  3.  Peripheral vascular disease.  4.  Hyperlipidemia.  5.  History of L4 diskitis in the past.  6.  Hyponatremia.  7.  Tobacco abuse in the past.  8.  Hypertension.  9.  Diabetes mellitus.  10.  Chronic kidney disease stage 3.  SOCIAL  HISTORY:  The patient lives at home, is still smoking, but 1 pack lasts her almost for 3 to 4 days.  She has been smoking since age of 81.  No alcohol.  No illicit drug use.    FAMILY HISTORY:  Significant for leukemia in father.   ALLERGIES:  CODEINE AND SULFA DRUGS.   HOME MEDICATIONS: 1.  Aleve 2 tablets oral daily 220 mg. 2.  Aspirin 81 mg oral daily.  3.  Losartan 25 mg oral daily.  4.  Gabapentin 100 mg oral 3 times a day.  5.  Januvia 50 mg oral daily.  6.  Pioglitazone45 mg oral daily.  7.  Atorvastatin 20 mg oral daily.  8.  Metoprolol tartrate 25 mg oral 2 times a day.  9.  Levothyroxine 75 mcg oral daily.  10.  Donepezil 5 mg oral daily.    REVIEW OF SYSTEMS:  The patient is a very poor historian.   CONSTITUTIONAL:  Denies any fever, chills.  Complains of fatigue, weakness and poor appetite at home.  EYES:  Denies blurry vision, double vision, inflammation, glaucoma.  EARS, NOSE, THROAT:  Denies tinnitus, ear pain, hearing loss, epistaxis or discharge.  RESPIRATORY:  Denies cough, wheezing, hemoptysis, or dyspnea.  CARDIOVASCULAR:  Denies chest pain, edema, arrhythmia, palpitation.  GASTROINTESTINAL:  Denies nausea, vomiting, diarrhea, abdominal pain, hematemesis, melena.  GENITOURINARY:  Denies dysuria, hematuria, renal colic.  ENDOCRINE:  Denies polyuria or polydipsia, heat or cold intolerance.  HEMATOLOGY:  Denies anemia, bleeding diathesis.  INTEGUMENTARY:  Denies acne, rash or skin  lesion.  MUSCULOSKELETAL:  Denies any swelling, gout.  Complains of lower back pain.  The patient is wheelchair dependent.   NEUROLOGIC:  There is no history of CVA, TIA or headache.  She has history of dementia.  PSYCHIATRIC:  Denies any anxiety or depression or nervousness.   PHYSICAL EXAMINATION: VITAL SIGNS:  Temperature 97.7, pulse 59, respiratory rate 20, blood pressure 120/58, saturating 96% on room air.  GENERAL:  Elderly female who looks comfortable in bed in no apparent  distress.  HEENT:  Head atraumatic, normocephalic.  Pupils equal, reactive to light.  Pink conjunctivae.  Anicteric sclerae.  Dry oral mucosa.  NECK:  Supple.  No thyromegaly.  No JVD.  CHEST:  Good air entry bilaterally.  No wheezing, rales or rhonchi.  CARDIOVASCULAR:  S1, S2 heard.  No rubs, murmurs or gallops.  ABDOMEN:  Soft, nontender, nondistended.  Bowel sounds present.  EXTREMITIES:  No edema.  No clubbing.  No cyanosis.  Pedal pulses felt bilaterally.  MUSCULOSKELETAL:  Has lower back tenderness to palpation.  No joint effusion or erythema.  SKIN:  Dry, delayed skin turgor.  LYMPHATIC:  No cervical lymphadenopathy could be appreciated.  NEUROLOGIC:  Cranial nerves grossly intact.  Motor grossly intact.  PSYCHIATRIC:  The patient is awake, alert x 2, mildly confused, pleasant.   PERTINENT LABORATORY DATA:  Glucose 475, BUN 53, creatinine 2.03, sodium 121, potassium 4.9, chloride 92, CO2 23.  Anion gap 6.  Troponin less than 0.02.  White blood cell 8.1, hemoglobin 14, hematocrit 42.1, platelets 181.  Urinalysis is trace leukocyte esterase, negative for nitrite, showing 1 white blood cell.   IMAGING STUDIES:   1.  Chest, portable, showing no active disease.  2.  Lumbar spine anterior, posterior and lateral showing no acute lumbar spine fracture and remote L2 and L4 compression fracture.  3.  Pelvis anterior-posterior showing no acute findings.   ASSESSMENT AND PLAN: 1.  Weakness with fall, this is most likely related to patient's hyperglycemia and dehydration, the patient will be admitted for hydration.  We will consult physical therapy.  2.  Uncontrolled diabetes mellitus with hyperglycemia, the patient does not appear to be in diabetic ketoacidosis, we will start the patient back on her home medication including Actos and Januvia, as well we will add insulin sliding scale.  We will give her IV fluid as well, a total of 2 liters as bolus and continue her on 75 mL per hour.  We will  check a glycol hemoglobin level.  3.  Chronic kidney disease, unclear what is her baseline, but this is most likely worsened by volume depletion.  We will continue with IV fluids.  We will hold losartan and Aleve for the time being.  4.  Hypertension:  Blood pressure acceptable.  Continue with metoprolol.  5.  Hyperlipidemia.  Continue with statin.  6.  Hypothyroidism.  Continue with Synthroid.  7.  Dementia.  Continue with Donepezil.  8.  Deep vein thrombosis prophylaxis.  SubQ heparin.  9.  CODE STATUS:  DAUGHTER REPORTS THE PATIENT IS FULL CODE.   Total time spent on admission and patient care 55 minutes.    ____________________________ Starleen Arms, MD dse:ea D: 05/25/2013 00:34:39 ET T: 05/25/2013 02:08:05 ET JOB#: [id number]  cc: Starleen Arms, MD, <Dictator> Adolfo Granieri Teena Irani MD ELECTRONICALLY SIGNED 05/25/2013 4:25

## 2015-02-18 ENCOUNTER — Observation Stay
Admission: EM | Admit: 2015-02-18 | Discharge: 2015-02-23 | Disposition: A | Discharge status: Home / Self Care | Payer: Medicare Other | Attending: Internal Medicine | Admitting: Internal Medicine

## 2015-02-18 ENCOUNTER — Encounter: Payer: Self-pay | Admitting: *Deleted

## 2015-02-18 DIAGNOSIS — F0390 Unspecified dementia without behavioral disturbance: Secondary | ICD-10-CM | POA: Insufficient documentation

## 2015-02-18 DIAGNOSIS — Z888 Allergy status to other drugs, medicaments and biological substances status: Secondary | ICD-10-CM | POA: Diagnosis not present

## 2015-02-18 DIAGNOSIS — R52 Pain, unspecified: Secondary | ICD-10-CM

## 2015-02-18 DIAGNOSIS — I129 Hypertensive chronic kidney disease with stage 1 through stage 4 chronic kidney disease, or unspecified chronic kidney disease: Secondary | ICD-10-CM | POA: Diagnosis not present

## 2015-02-18 DIAGNOSIS — Z7984 Long term (current) use of oral hypoglycemic drugs: Secondary | ICD-10-CM | POA: Diagnosis not present

## 2015-02-18 DIAGNOSIS — Z993 Dependence on wheelchair: Secondary | ICD-10-CM | POA: Insufficient documentation

## 2015-02-18 DIAGNOSIS — E1122 Type 2 diabetes mellitus with diabetic chronic kidney disease: Secondary | ICD-10-CM | POA: Insufficient documentation

## 2015-02-18 DIAGNOSIS — Z882 Allergy status to sulfonamides status: Secondary | ICD-10-CM | POA: Diagnosis not present

## 2015-02-18 DIAGNOSIS — M549 Dorsalgia, unspecified: Secondary | ICD-10-CM | POA: Diagnosis present

## 2015-02-18 DIAGNOSIS — J449 Chronic obstructive pulmonary disease, unspecified: Secondary | ICD-10-CM | POA: Diagnosis not present

## 2015-02-18 DIAGNOSIS — M4854XA Collapsed vertebra, not elsewhere classified, thoracic region, initial encounter for fracture: Principal | ICD-10-CM | POA: Insufficient documentation

## 2015-02-18 DIAGNOSIS — M4856XA Collapsed vertebra, not elsewhere classified, lumbar region, initial encounter for fracture: Secondary | ICD-10-CM | POA: Diagnosis not present

## 2015-02-18 DIAGNOSIS — E785 Hyperlipidemia, unspecified: Secondary | ICD-10-CM | POA: Insufficient documentation

## 2015-02-18 DIAGNOSIS — M5136 Other intervertebral disc degeneration, lumbar region: Secondary | ICD-10-CM | POA: Diagnosis not present

## 2015-02-18 DIAGNOSIS — M545 Low back pain: Secondary | ICD-10-CM | POA: Insufficient documentation

## 2015-02-18 DIAGNOSIS — Z794 Long term (current) use of insulin: Secondary | ICD-10-CM | POA: Insufficient documentation

## 2015-02-18 DIAGNOSIS — S32029A Unspecified fracture of second lumbar vertebra, initial encounter for closed fracture: Secondary | ICD-10-CM

## 2015-02-18 DIAGNOSIS — F17200 Nicotine dependence, unspecified, uncomplicated: Secondary | ICD-10-CM | POA: Insufficient documentation

## 2015-02-18 DIAGNOSIS — K5900 Constipation, unspecified: Secondary | ICD-10-CM | POA: Diagnosis not present

## 2015-02-18 DIAGNOSIS — Z7951 Long term (current) use of inhaled steroids: Secondary | ICD-10-CM | POA: Insufficient documentation

## 2015-02-18 DIAGNOSIS — Z720 Tobacco use: Secondary | ICD-10-CM | POA: Diagnosis present

## 2015-02-18 DIAGNOSIS — E039 Hypothyroidism, unspecified: Secondary | ICD-10-CM | POA: Diagnosis present

## 2015-02-18 DIAGNOSIS — N183 Chronic kidney disease, stage 3 unspecified: Secondary | ICD-10-CM | POA: Diagnosis present

## 2015-02-18 DIAGNOSIS — Z9889 Other specified postprocedural states: Secondary | ICD-10-CM | POA: Diagnosis not present

## 2015-02-18 DIAGNOSIS — Z79899 Other long term (current) drug therapy: Secondary | ICD-10-CM | POA: Diagnosis not present

## 2015-02-18 DIAGNOSIS — Z419 Encounter for procedure for purposes other than remedying health state, unspecified: Secondary | ICD-10-CM

## 2015-02-18 DIAGNOSIS — R05 Cough: Secondary | ICD-10-CM | POA: Diagnosis not present

## 2015-02-18 DIAGNOSIS — K8020 Calculus of gallbladder without cholecystitis without obstruction: Secondary | ICD-10-CM | POA: Insufficient documentation

## 2015-02-18 DIAGNOSIS — G8929 Other chronic pain: Secondary | ICD-10-CM | POA: Diagnosis not present

## 2015-02-18 DIAGNOSIS — Z885 Allergy status to narcotic agent status: Secondary | ICD-10-CM | POA: Diagnosis not present

## 2015-02-18 DIAGNOSIS — M2578 Osteophyte, vertebrae: Secondary | ICD-10-CM | POA: Insufficient documentation

## 2015-02-18 DIAGNOSIS — J9811 Atelectasis: Secondary | ICD-10-CM | POA: Insufficient documentation

## 2015-02-18 DIAGNOSIS — S32020A Wedge compression fracture of second lumbar vertebra, initial encounter for closed fracture: Secondary | ICD-10-CM

## 2015-02-18 DIAGNOSIS — R059 Cough, unspecified: Secondary | ICD-10-CM

## 2015-02-18 DIAGNOSIS — E119 Type 2 diabetes mellitus without complications: Secondary | ICD-10-CM

## 2015-02-18 DIAGNOSIS — M4806 Spinal stenosis, lumbar region: Secondary | ICD-10-CM | POA: Insufficient documentation

## 2015-02-18 DIAGNOSIS — S32020G Wedge compression fracture of second lumbar vertebra, subsequent encounter for fracture with delayed healing: Secondary | ICD-10-CM

## 2015-02-18 DIAGNOSIS — K8689 Other specified diseases of pancreas: Secondary | ICD-10-CM | POA: Diagnosis not present

## 2015-02-18 HISTORY — DX: Disorder of kidney and ureter, unspecified: N28.9

## 2015-02-18 HISTORY — DX: Type 2 diabetes mellitus without complications: E11.9

## 2015-02-18 NOTE — ED Notes (Addendum)
Pt to triage in a wheelchair.   Pt reports lower back pain.  Sx began yesterday.  No injury to back   Pt has not walked in 4-5 years secondary to spinal infection.  No n/v/d.  Pt [facility] and pt was treated for constipation.

## 2015-02-18 NOTE — ED Notes (Signed)
Pt presents to ED with c/o severe lower bilateral back pain w/o radiation since yesterday. Pt reports pain is a 10/10 and "feels like it's my kidneys". Pt denies any recent injury or trauma, and has not walked in 4-5 yrs d/t a spinal infection that started out as a kidney infection. Pt is A&O, in mild distress d/t pain, with daughter at bedside. Pt's respirations are regular, even and unlabored, but slightly tachypnic d/t pian.

## 2015-02-19 ENCOUNTER — Emergency Department: Payer: Medicare Other

## 2015-02-19 ENCOUNTER — Observation Stay: Payer: Medicare Other

## 2015-02-19 DIAGNOSIS — N183 Chronic kidney disease, stage 3 unspecified: Secondary | ICD-10-CM | POA: Diagnosis present

## 2015-02-19 DIAGNOSIS — E785 Hyperlipidemia, unspecified: Secondary | ICD-10-CM | POA: Diagnosis present

## 2015-02-19 DIAGNOSIS — N2889 Other specified disorders of kidney and ureter: Secondary | ICD-10-CM | POA: Diagnosis present

## 2015-02-19 DIAGNOSIS — M4854XA Collapsed vertebra, not elsewhere classified, thoracic region, initial encounter for fracture: Secondary | ICD-10-CM | POA: Diagnosis not present

## 2015-02-19 DIAGNOSIS — K5900 Constipation, unspecified: Secondary | ICD-10-CM | POA: Diagnosis present

## 2015-02-19 DIAGNOSIS — M549 Dorsalgia, unspecified: Secondary | ICD-10-CM | POA: Diagnosis present

## 2015-02-19 DIAGNOSIS — E039 Hypothyroidism, unspecified: Secondary | ICD-10-CM | POA: Diagnosis present

## 2015-02-19 DIAGNOSIS — S32020G Wedge compression fracture of second lumbar vertebra, subsequent encounter for fracture with delayed healing: Secondary | ICD-10-CM

## 2015-02-19 DIAGNOSIS — E119 Type 2 diabetes mellitus without complications: Secondary | ICD-10-CM

## 2015-02-19 DIAGNOSIS — Z720 Tobacco use: Secondary | ICD-10-CM | POA: Diagnosis present

## 2015-02-19 LAB — GLUCOSE, CAPILLARY
GLUCOSE-CAPILLARY: 72 mg/dL (ref 65–99)
GLUCOSE-CAPILLARY: 115 mg/dL — AB (ref 65–99)
GLUCOSE-CAPILLARY: 46 mg/dL — AB (ref 65–99)
GLUCOSE-CAPILLARY: 88 mg/dL (ref 65–99)
Glucose-Capillary: 53 mg/dL — ABNORMAL LOW (ref 65–99)
Glucose-Capillary: 80 mg/dL (ref 65–99)

## 2015-02-19 LAB — CBC WITH DIFFERENTIAL/PLATELET
BASOS ABS: 0.1 10*3/uL (ref 0–0.1)
Basophils Relative: 1 %
Eosinophils Absolute: 0.1 10*3/uL (ref 0–0.7)
HEMATOCRIT: 39.9 % (ref 35.0–47.0)
Hemoglobin: 13.1 g/dL (ref 12.0–16.0)
Lymphs Abs: 3.5 10*3/uL (ref 1.0–3.6)
MCH: 29.5 pg (ref 26.0–34.0)
MCHC: 32.8 g/dL (ref 32.0–36.0)
MCV: 90.1 fL (ref 80.0–100.0)
MONO ABS: 1 10*3/uL — AB (ref 0.2–0.9)
Neutro Abs: 7.1 10*3/uL — ABNORMAL HIGH (ref 1.4–6.5)
Neutrophils Relative %: 60 %
PLATELETS: 232 10*3/uL (ref 150–440)
RBC: 4.43 MIL/uL (ref 3.80–5.20)
RDW: 14.4 % (ref 11.5–14.5)
WBC: 11.8 10*3/uL — AB (ref 3.6–11.0)

## 2015-02-19 LAB — URINALYSIS COMPLETE WITH MICROSCOPIC (ARMC ONLY)
Bacteria, UA: NONE SEEN
Bilirubin Urine: NEGATIVE
Glucose, UA: NEGATIVE mg/dL
KETONES UR: NEGATIVE mg/dL
Leukocytes, UA: NEGATIVE
NITRITE: NEGATIVE
PROTEIN: NEGATIVE mg/dL
SPECIFIC GRAVITY, URINE: 1.009 (ref 1.005–1.030)
WBC UA: NONE SEEN WBC/hpf (ref 0–5)
pH: 6 (ref 5.0–8.0)

## 2015-02-19 LAB — COMPREHENSIVE METABOLIC PANEL
ALT: 16 U/L (ref 14–54)
ANION GAP: 7 (ref 5–15)
AST: 33 U/L (ref 15–41)
Albumin: 3.5 g/dL (ref 3.5–5.0)
Alkaline Phosphatase: 139 U/L — ABNORMAL HIGH (ref 38–126)
BILIRUBIN TOTAL: 0.7 mg/dL (ref 0.3–1.2)
BUN: 29 mg/dL — ABNORMAL HIGH (ref 6–20)
CHLORIDE: 103 mmol/L (ref 101–111)
CO2: 25 mmol/L (ref 22–32)
Calcium: 8.7 mg/dL — ABNORMAL LOW (ref 8.9–10.3)
Creatinine, Ser: 1.5 mg/dL — ABNORMAL HIGH (ref 0.44–1.00)
GFR, EST AFRICAN AMERICAN: 36 mL/min — AB (ref >60)
GFR, EST NON AFRICAN AMERICAN: 31 mL/min — AB (ref >60)
Glucose, Bld: 71 mg/dL (ref 65–99)
POTASSIUM: 4.7 mmol/L (ref 3.5–5.1)
Sodium: 135 mmol/L (ref 135–145)
TOTAL PROTEIN: 7.7 g/dL (ref 6.5–8.1)

## 2015-02-19 MED ORDER — ONDANSETRON HCL 4 MG/2ML IJ SOLN: 4.0000 mg | Freq: Four times a day (QID) | INTRAMUSCULAR | Status: DC | PRN

## 2015-02-19 MED ORDER — POLYETHYLENE GLYCOL 3350 17 G PO PACK: 17.0000 g | PACK | Freq: Every day | ORAL | Status: DC | PRN

## 2015-02-19 MED ORDER — HYDROCODONE-ACETAMINOPHEN 5-325 MG PO TABS
1.0000 | ORAL_TABLET | ORAL | Status: DC | PRN
  Administered 2015-02-19 – 2015-02-23 (×11): 1 via ORAL
  Filled 2015-02-19 (×12): qty 1

## 2015-02-19 MED ORDER — FLUTICASONE PROPIONATE 50 MCG/ACT NA SUSP
2.0000 | Freq: Every day | NASAL | Status: DC
  Administered 2015-02-19 – 2015-02-22 (×4): 2 via NASAL
  Filled 2015-02-19: qty 16

## 2015-02-19 MED ORDER — PIOGLITAZONE HCL 30 MG PO TABS
45.0000 mg | ORAL_TABLET | Freq: Every day | ORAL | Status: DC
  Administered 2015-02-19 – 2015-02-22 (×4): 45 mg via ORAL
  Filled 2015-02-19 (×4): qty 2

## 2015-02-19 MED ORDER — ENOXAPARIN SODIUM 30 MG/0.3ML ~~LOC~~ SOLN
30.0000 mg | SUBCUTANEOUS | Status: DC
  Administered 2015-02-19 – 2015-02-21 (×3): 30 mg via SUBCUTANEOUS
  Filled 2015-02-19 (×3): qty 0.3

## 2015-02-19 MED ORDER — DONEPEZIL HCL 5 MG PO TABS
10.0000 mg | ORAL_TABLET | Freq: Every day | ORAL | Status: DC
  Administered 2015-02-19 – 2015-02-23 (×5): 10 mg via ORAL
  Filled 2015-02-19 (×5): qty 2

## 2015-02-19 MED ORDER — FENTANYL CITRATE (PF) 100 MCG/2ML IJ SOLN
25.0000 ug | Freq: Once | INTRAMUSCULAR | Status: AC
  Administered 2015-02-19: 25 ug via INTRAVENOUS

## 2015-02-19 MED ORDER — ATORVASTATIN CALCIUM 20 MG PO TABS
20.0000 mg | ORAL_TABLET | Freq: Every day | ORAL | Status: DC
  Administered 2015-02-19 – 2015-02-23 (×5): 20 mg via ORAL
  Filled 2015-02-19 (×5): qty 1

## 2015-02-19 MED ORDER — FENTANYL CITRATE (PF) 100 MCG/2ML IJ SOLN
50.0000 ug | Freq: Once | INTRAMUSCULAR | Status: AC
  Administered 2015-02-19: 50 ug via INTRAVENOUS
  Filled 2015-02-19: qty 2

## 2015-02-19 MED ORDER — FENTANYL CITRATE (PF) 100 MCG/2ML IJ SOLN: 25.0000 ug | INTRAMUSCULAR | Status: DC | PRN

## 2015-02-19 MED ORDER — INSULIN GLARGINE 100 UNIT/ML ~~LOC~~ SOLN
25.0000 [IU] | Freq: Every day | SUBCUTANEOUS | Status: DC
  Administered 2015-02-19: 25 [IU] via SUBCUTANEOUS
  Filled 2015-02-19 (×2): qty 0.25

## 2015-02-19 MED ORDER — BUDESONIDE 0.25 MG/2ML IN SUSP
0.2500 mg | Freq: Two times a day (BID) | RESPIRATORY_TRACT | Status: DC
  Administered 2015-02-19 – 2015-02-22 (×7): 0.25 mg via RESPIRATORY_TRACT
  Filled 2015-02-19 (×7): qty 2

## 2015-02-19 MED ORDER — ALBUTEROL SULFATE (2.5 MG/3ML) 0.083% IN NEBU: 2.5000 mg | INHALATION_SOLUTION | RESPIRATORY_TRACT | Status: DC | PRN

## 2015-02-19 MED ORDER — LEVOTHYROXINE SODIUM 50 MCG PO TABS
75.0000 ug | ORAL_TABLET | Freq: Every day | ORAL | Status: DC
  Administered 2015-02-20 – 2015-02-21 (×2): 75 ug via ORAL
  Filled 2015-02-19 (×3): qty 1

## 2015-02-19 MED ORDER — DOCUSATE SODIUM 100 MG PO CAPS
100.0000 mg | ORAL_CAPSULE | Freq: Two times a day (BID) | ORAL | Status: DC
  Administered 2015-02-19 – 2015-02-23 (×9): 100 mg via ORAL
  Filled 2015-02-19 (×9): qty 1

## 2015-02-19 MED ORDER — INSULIN ASPART 100 UNIT/ML ~~LOC~~ SOLN
0.0000 [IU] | Freq: Three times a day (TID) | SUBCUTANEOUS | Status: DC
  Administered 2015-02-22: 2 [IU] via SUBCUTANEOUS
  Administered 2015-02-23: 1 [IU] via SUBCUTANEOUS
  Administered 2015-02-23: 2 [IU] via SUBCUTANEOUS
  Filled 2015-02-19 (×2): qty 2
  Filled 2015-02-19: qty 1

## 2015-02-19 MED ORDER — INSULIN GLARGINE 100 UNIT/ML SOLOSTAR PEN: 25.0000 [IU] | PEN_INJECTOR | Freq: Every day | SUBCUTANEOUS | Status: DC

## 2015-02-19 MED ORDER — POLYETHYLENE GLYCOL 3350 17 GM/SCOOP PO POWD
17.0000 g | Freq: Every day | ORAL | Status: DC | PRN
  Filled 2015-02-19: qty 255

## 2015-02-19 MED ORDER — FENTANYL CITRATE (PF) 100 MCG/2ML IJ SOLN
25.0000 ug | Freq: Once | INTRAMUSCULAR | Status: AC
  Administered 2015-02-19: 25 ug via INTRAVENOUS
  Filled 2015-02-19: qty 2

## 2015-02-19 MED ORDER — ONDANSETRON HCL 4 MG/2ML IJ SOLN
4.0000 mg | Freq: Once | INTRAMUSCULAR | Status: AC
  Administered 2015-02-19: 4 mg via INTRAVENOUS
  Filled 2015-02-19: qty 2

## 2015-02-19 MED ORDER — HYDROCODONE-ACETAMINOPHEN 5-325 MG PO TABS
1.0000 | ORAL_TABLET | Freq: Four times a day (QID) | ORAL | Status: DC | PRN
  Administered 2015-02-19: 1 via ORAL
  Filled 2015-02-19: qty 1

## 2015-02-19 MED ORDER — ONDANSETRON HCL 4 MG/2ML IJ SOLN: 4.0000 mg | Freq: Three times a day (TID) | INTRAMUSCULAR | Status: DC

## 2015-02-19 MED ORDER — NICOTINE 10 MG IN INHA
1.0000 | RESPIRATORY_TRACT | Status: DC | PRN
  Administered 2015-02-19 – 2015-02-21 (×2): 1 via RESPIRATORY_TRACT
  Filled 2015-02-19: qty 36

## 2015-02-19 MED ORDER — IOHEXOL 300 MG/ML  SOLN
75.0000 mL | Freq: Once | INTRAMUSCULAR | Status: AC | PRN
  Administered 2015-02-19: 72 mL via INTRAVENOUS

## 2015-02-19 MED ORDER — ALBUTEROL SULFATE HFA 108 (90 BASE) MCG/ACT IN AERS: 2.0000 | INHALATION_SPRAY | RESPIRATORY_TRACT | Status: DC | PRN

## 2015-02-19 MED ORDER — LINAGLIPTIN 5 MG PO TABS
5.0000 mg | ORAL_TABLET | Freq: Every day | ORAL | Status: DC
  Administered 2015-02-19 – 2015-02-22 (×4): 5 mg via ORAL
  Filled 2015-02-19 (×4): qty 1

## 2015-02-19 MED ORDER — MOMETASONE FUROATE 220 MCG/INH IN AEPB: 2.0000 | INHALATION_SPRAY | Freq: Every day | RESPIRATORY_TRACT | Status: DC

## 2015-02-19 MED ORDER — IOHEXOL 240 MG/ML SOLN
25.0000 mL | Freq: Once | INTRAMUSCULAR | Status: AC | PRN
  Administered 2015-02-19: 25 mL via ORAL

## 2015-02-19 NOTE — Progress Notes (Signed)
Recheck of BG improved. Dr. Joice LoftsPoggi saw pt this evening during rounds. New orders for MRI. Pt transported at 1820 this evening.

## 2015-02-19 NOTE — H&P (Addendum)
[facility] [place] [facility]   PATIENT NAME: Tasha Grant    MR#:  [id number]  DATE OF BIRTH:  07-19-1930  DATE OF ADMISSION:  02/18/2015  PRIMARY CARE PHYSICIAN: Hyman Hopes, MD   REQUESTING/REFERRING PHYSICIAN: Darci Current, MD  CHIEF COMPLAINT:   Chief Complaint  Patient presents with  . Back Pain    HISTORY OF PRESENT ILLNESS:  Tasha Grant  is a 81 y.o. female with a known history of with a past medical history of type 2 diabetes, hyperlipidemia, chronic renal disease, osteoporosis, tobacco use disorder who comes to the emergency department due to recurrence of sharp mid to lower back pain since about 2 days ago. Per patient, 4-5 years ago she had a kidney infection that resulted in her getting a spinal infection. She has a chronic L2 vertebrae compression fracture. She gets exacerbation of pain on some occasions. At home, she is mostly bed or wheelchair bound due to this. She denies any activity or trauma to the area when the pain began. She states that she was on her bedpan at home when the pain started. She denies fever, chills, fatigue, malaise, dysuria, frequency, hematuria, suprapubic pain, nausea or emesis. She was constipated yesterday, but was treated at [facility] clinic.  She denies headache, vision changes, chest pain, dyspnea. She is currently in no acute distress and states that her pain is much better after she received IV fentanyl.  PAST MEDICAL HISTORY:   Past Medical History  Diagnosis Date  . Renal disorder   . Diabetes mellitus without complication (HCC)     PAST SURGICAL HISTORY:   Past Surgical History  Procedure Laterality Date  . Tonsillectomy      SOCIAL HISTORY:   Social History  Substance Use Topics  . Smoking status: Current Every Day Smoker  . Smokeless tobacco: Not on file  . Alcohol Use: No    FAMILY HISTORY:  No family history on file.  DRUG ALLERGIES:   Allergies  Allergen  Reactions  . Codeine Itching  . Morphine And Related Itching  . Quinapril Other (See Comments)  . Sulfa Antibiotics Nausea Only    REVIEW OF SYSTEMS:   Review of Systems  Constitutional: Negative for fever and chills.  HENT: Negative for congestion.   Eyes: Negative for blurred vision, double vision and photophobia.  Respiratory: Positive for cough. Negative for hemoptysis and shortness of breath.   Cardiovascular: Negative for chest pain, palpitations and orthopnea.  Gastrointestinal: Positive for constipation. Negative for heartburn, nausea, vomiting and diarrhea.  Genitourinary: Negative for frequency and hematuria.  Musculoskeletal: Positive for back pain and joint pain. Negative for neck pain.  Skin: Negative for rash.  Neurological: Negative for dizziness and speech change.  Endo/Heme/Allergies: Does not bruise/bleed easily.  Psychiatric/Behavioral: The patient is not nervous/anxious.     MEDICATIONS AT HOME:   Prior to Admission medications   Medication Sig Start Date End Date Taking? Authorizing Provider  alendronate (FOSAMAX) 10 MG tablet Take 1 tablet by mouth once a week. 02/07/15  Yes Historical Provider, MD  ASMANEX 60 METERED DOSES 220 MCG/INH inhaler Inhale 2 puffs into the lungs daily. 02/17/15  Yes Historical Provider, MD  atorvastatin (LIPITOR) 20 MG tablet Take 1 tablet by mouth daily. 02/17/15  Yes Historical Provider, MD  donepezil (ARICEPT) 10 MG tablet Take 1 tablet by mouth daily. 02/17/15  Yes Historical Provider, MD  fluticasone (FLONASE) 50 MCG/ACT nasal spray Place 2 sprays into both nostrils daily.  01/24/15  Yes Historical Provider, MD  JANUVIA 50 MG tablet Take 1 tablet by mouth daily. 02/17/15  Yes Historical Provider, MD  LANTUS SOLOSTAR 100 UNIT/ML Solostar Pen Inject 25 Units into the skin daily. 02/17/15  Yes Historical Provider, MD  levothyroxine (SYNTHROID, LEVOTHROID) 75 MCG tablet Take 1 tablet by mouth daily. 02/17/15  Yes Historical Provider,  MD  pioglitazone (ACTOS) 45 MG tablet Take 1 tablet by mouth daily. 02/17/15  Yes Historical Provider, MD  polyethylene glycol powder (GLYCOLAX/MIRALAX) powder Take 17 g by mouth daily as needed. 02/17/15  Yes Historical Provider, MD  VENTOLIN HFA 108 (90 BASE) MCG/ACT inhaler Inhale 2 puffs into the lungs every 4 (four) hours as needed. 02/17/15   Historical Provider, MD      VITAL SIGNS:  Blood pressure 128/79, pulse 86, temperature 98.6 F (37 C), temperature source Oral, resp. rate 16, height 5\' 6"  (1.676 m), weight 65.772 kg (145 lb), SpO2 96 %.  PHYSICAL EXAMINATION:  Physical Exam  GENERAL:  81 y.o.-year-old patient lying in the bed with no acute distress. States that pain is only present when she moves certain ways.  EYES: Pupils equal, round, reactive to light and accommodation. No scleral icterus. Extraocular muscles intact. Oral mucous are moist. HEENT: Head atraumatic, normocephalic. Oropharynx and nasopharynx clear.  NECK:  Supple, no jugular venous distention. No thyroid enlargement, no tenderness.  LUNGS: Normal breath sounds bilaterally, no wheezing, rales,rhonchi or crepitation. No use of accessory muscles of respiration.  CARDIOVASCULAR: S1, S2 normal. No murmurs, rubs, or gallops.  ABDOMEN: Soft, nontender, nondistended. Bowel sounds present. No organomegaly or mass.  EXTREMITIES: No pedal edema, cyanosis, or clubbing.  NEUROLOGIC: Cranial nerves II through XII are intact. Moves all extremities, but decreased strength bilaterally on lower extremities. Sensation mildly decreased on both extremities. Gait not checked due to the patient's inability to get out of bed.Marland Kitchen  PSYCHIATRIC: The patient is alert and oriented x 3.  SKIN: No obvious rash, lesion, or ulcer.   LABORATORY PANEL:   CBC  Recent Labs Lab 02/18/15 2245  WBC 11.8*  HGB 13.1  HCT 39.9  PLT 232    ------------------------------------------------------------------------------------------------------------------  Chemistries   Recent Labs Lab 02/18/15 2245  NA 135  K 4.7  CL 103  CO2 25  GLUCOSE 71  BUN 29*  CREATININE 1.50*  CALCIUM 8.7*  AST 33  ALT 16  ALKPHOS 139*  BILITOT 0.7   ------------------------------------------------------------------------------------------------------------------  Cardiac Enzymes No results for input(s): TROPONINI in the last 168 hours. ------------------------------------------------------------------------------------------------------------------  RADIOLOGY:  Ct Abdomen Pelvis W Contrast  02/19/2015  CLINICAL DATA:  Acute onset of severe lower bilateral back pain. Initial encounter. EXAM: CT ABDOMEN AND PELVIS WITH CONTRAST TECHNIQUE: Multidetector CT imaging of the abdomen and pelvis was performed using the standard protocol following bolus administration of intravenous contrast. CONTRAST:  72mL OMNIPAQUE IOHEXOL 300 MG/ML  SOLN COMPARISON:  CT of the lumbar spine performed 08/08/2014, and CT of the abdomen and pelvis performed 12/24/2009 FINDINGS: Mild bibasilar atelectasis and scarring are noted. There is mild nonspecific prominence of the intrahepatic biliary ducts. This may reflect the patient's baseline. A few tiny stones are noted dependently within the gallbladder. Minimal vascular calcifications are seen within the spleen. A few tiny calcified granulomata are noted within the left hepatic lobe. The liver is otherwise unremarkable. Calcification at the body of the pancreas may reflect sequelae of chronic pancreatitis. There appear to be several large stones within the pancreatic duct at the head of the pancreas, measuring  up to 8 mm in size, with dilatation of the pancreatic duct to 8 mm in diameter. There is diffuse atrophy of the pancreas. The stones are unchanged from April, though much more prominent than in 2011. Pancreatic  atrophy is mostly new from 2011. The adrenal glands are unremarkable in appearance. The kidneys are unremarkable in appearance. There is no evidence of hydronephrosis. No renal or ureteral stones are seen. No perinephric stranding is appreciated. No free fluid is identified. The small bowel is unremarkable in appearance. The stomach is within normal limits. No acute vascular abnormalities are seen. Diffuse calcification is noted along the abdominal aorta and its branches. The appendix is normal in caliber, without evidence of appendicitis. The colon is grossly unremarkable in appearance. The bladder is mildly distended and grossly unremarkable. The uterus is unremarkable. The ovaries are relatively symmetric. No suspicious adnexal masses are seen. No inguinal lymphadenopathy is seen. No acute osseous abnormalities are identified. The patient's left femoral hardware is grossly unremarkable, though incompletely imaged. There is a stable chronic severe compression deformity of vertebral body L2, and mild chronic compression deformity involving the remaining lumbar vertebral bodies. IMPRESSION: 1. No definite acute abnormality seen to explain the patient's symptoms. 2. Chronic obstruction of the pancreatic duct, with dilatation of the pancreatic duct to 8 mm in diameter, and diffuse atrophy of the pancreas. Several large stones within the pancreatic duct at the head of the pancreas measure up to 8 mm in size. These are unchanged from April, though much more prominent than in 2011. Pancreatic atrophy is mostly new from 2011. 3. Mild nonspecific prominence of the intrahepatic biliary ducts; this may reflect the patient's baseline. Mild cholelithiasis noted. Gallbladder otherwise unremarkable. 4. Diffuse calcification along the abdominal aorta and its branches. 5. Stable chronic severe compression deformity of vertebral body L2, and mild chronic compression deformity involving the remaining lumbar vertebral bodies. 6. Mild  bibasilar atelectasis and scarring noted. Electronically Signed   By: Roanna Raider M.D.   On: 02/19/2015 02:27      IMPRESSION AND PLAN:   Patient Active Problem List   Diagnosis Date Noted  . Intractable back pain Continue fentanyl as needed. Per patient, her pain is controlled very well now, unless she tries to move certain way or gets in certain positions.  02/19/2015  . Compression fracture of L2 lumbar vertebra with delayed healing (HCC) As above mentioned.  I suggested to the patient to return to the neurosurgeon for reevaluation.  02/19/2015  . Hyperlipidemia Continue atorvastatin. Monitor LFTs regularly.  02/19/2015  . Hypothyroidism Continue levothyroxine.  Monitor TSH as an outpatient.  02/19/2015  . Type 2 diabetes mellitus. Continue Lantus as used at home.  CBG monitoring with regular insulin sliding scale while the patient is in the hospital.  02/19/2015  . Constipation Continue MiraLAX as needed.  02/19/2015  . Tobacco abuse disorder The patient declined nicotine replacement therapy that was offered.  02/19/2015  . Chronic renal insufficiency, stage III (moderate) Stable, when compared to most recent previous measurements earlier this year. Monitor BUN and creatinine.  02/19/2015       All the records are reviewed and case discussed with ED provider. Management plans discussed with the patient, family and they are in agreement.  CODE STATUS: Full Code  TOTAL TIME TAKING CARE OF THIS PATIENT: Over 70 minutes were spent during the whole process of his admission.Bobette Mo M.D on 02/19/2015 at 5:55 AM  Between 7am to 6pm -  Pager - [phone]  After 6pm go to [email or web address] - password [email or web address]  [facility]  Office  [phone]  CC: Primary care physician; Hyman HopesBurns, Harriett P, MD

## 2015-02-19 NOTE — Consult Note (Signed)
ORTHOPAEDIC CONSULTATION  REQUESTING PHYSICIAN: Houston Siren, MD  Chief Complaint:   Increased lower back pain  History of Present Illness: Tasha Grant is a 81 y.o. female with multiple medical problems, including lumbar degenerative disc disease with stenosis and an old severe L2 compression fracture who has been treated with epidural steroid injections by a neurosurgeon in the past. The patient notes that she gets recurrent episodes of significant lower back pain which usually respond to lying down on a hot water bottle. The patient had been at [facility] at [facility] until last week when she returned home. Over the past few days, the patient notes worsening pain in her lower back. The patient is a nonambulator and gets around in a wheelchair. She is able to self transfer from her bed to wheelchair. She denies any new injuries or falls during any of these transfers, but appears to be suffering from some early dementia. The patient states that she is quite comfortable at the moment she is lying on her right side in her bed. She denies any numbness or paresthesias down her leg, and denies any bowel or bladder complaints.  Past Medical History  Diagnosis Date  . Renal disorder   . Diabetes mellitus without complication [facility])    Past Surgical History  Procedure Laterality Date  . Tonsillectomy     Social History   Social History  . Marital Status: Married    Spouse Name: N/A  . Number of Children: N/A  . Years of Education: N/A   Social History Main Topics  . Smoking status: Current Every Day Smoker  . Smokeless tobacco: None  . Alcohol Use: No  . Drug Use: None  . Sexual Activity: Not Asked   Other Topics Concern  . None   Social History Narrative   History reviewed. No pertinent family history. Allergies  Allergen Reactions  . Codeine Itching  . Morphine And Related Itching  . Quinapril Other  (See Comments)  . Sulfa Antibiotics Nausea Only   Prior to Admission medications   Medication Sig Start Date End Date Taking? Authorizing Chestine Belknap  ASMANEX 60 METERED DOSES 220 MCG/INH inhaler Inhale 2 puffs into the lungs daily. 02/17/15  Yes Historical Lajuana Patchell, MD  atorvastatin (LIPITOR) 20 MG tablet Take 1 tablet by mouth daily. 02/17/15  Yes Historical Ineta Sinning, MD  donepezil (ARICEPT) 10 MG tablet Take 1 tablet by mouth at bedtime.  02/17/15  Yes Historical Serenity Batley, MD  fluticasone (FLONASE) 50 MCG/ACT nasal spray Place 2 sprays into both nostrils daily. 01/24/15  Yes Historical Tsuneo Faison, MD  JANUVIA 50 MG tablet Take 1 tablet by mouth daily. 02/17/15  Yes Historical Tyjai Matuszak, MD  LANTUS SOLOSTAR 100 UNIT/ML Solostar Pen Inject 25 Units into the skin daily. 02/17/15  Yes Historical Tytianna Greenley, MD  levothyroxine (SYNTHROID, LEVOTHROID) 75 MCG tablet Take 1 tablet by mouth daily. 02/17/15  Yes Historical Kareem Cathey, MD  pioglitazone (ACTOS) 45 MG tablet Take 1 tablet by mouth daily. 02/17/15  Yes Historical Mckinnley Cottier, MD  polyethylene glycol powder (GLYCOLAX/MIRALAX) powder Take 17 g by mouth daily as needed. 02/17/15  Yes Historical Launi Asencio, MD  VENTOLIN HFA 108 (90 BASE) MCG/ACT inhaler Inhale 2 puffs into the lungs every 4 (four) hours as needed. 02/17/15  Yes Historical Arianis Bowditch, MD  alendronate (FOSAMAX) 10 MG tablet Take 1 tablet by mouth once a week. 02/07/15   Historical Corene Resnick, MD   Ct Abdomen Pelvis W Contrast  02/19/2015  CLINICAL DATA:  Acute onset of severe lower bilateral  back pain. Initial encounter. EXAM: CT ABDOMEN AND PELVIS WITH CONTRAST TECHNIQUE: Multidetector CT imaging of the abdomen and pelvis was performed using the standard protocol following bolus administration of intravenous contrast. CONTRAST:  72mL OMNIPAQUE IOHEXOL 300 MG/ML  SOLN COMPARISON:  CT of the lumbar spine performed 08/08/2014, and CT of the abdomen and pelvis performed 12/24/2009 FINDINGS: Mild  bibasilar atelectasis and scarring are noted. There is mild nonspecific prominence of the intrahepatic biliary ducts. This may reflect the patient's baseline. A few tiny stones are noted dependently within the gallbladder. Minimal vascular calcifications are seen within the spleen. A few tiny calcified granulomata are noted within the left hepatic lobe. The liver is otherwise unremarkable. Calcification at the body of the pancreas may reflect sequelae of chronic pancreatitis. There appear to be several large stones within the pancreatic duct at the head of the pancreas, measuring up to 8 mm in size, with dilatation of the pancreatic duct to 8 mm in diameter. There is diffuse atrophy of the pancreas. The stones are unchanged from April, though much more prominent than in 2011. Pancreatic atrophy is mostly new from 2011. The adrenal glands are unremarkable in appearance. The kidneys are unremarkable in appearance. There is no evidence of hydronephrosis. No renal or ureteral stones are seen. No perinephric stranding is appreciated. No free fluid is identified. The small bowel is unremarkable in appearance. The stomach is within normal limits. No acute vascular abnormalities are seen. Diffuse calcification is noted along the abdominal aorta and its branches. The appendix is normal in caliber, without evidence of appendicitis. The colon is grossly unremarkable in appearance. The bladder is mildly distended and grossly unremarkable. The uterus is unremarkable. The ovaries are relatively symmetric. No suspicious adnexal masses are seen. No inguinal lymphadenopathy is seen. No acute osseous abnormalities are identified. The patient's left femoral hardware is grossly unremarkable, though incompletely imaged. There is a stable chronic severe compression deformity of vertebral body L2, and mild chronic compression deformity involving the remaining lumbar vertebral bodies. IMPRESSION: 1. No definite acute abnormality seen to  explain the patient's symptoms. 2. Chronic obstruction of the pancreatic duct, with dilatation of the pancreatic duct to 8 mm in diameter, and diffuse atrophy of the pancreas. Several large stones within the pancreatic duct at the head of the pancreas measure up to 8 mm in size. These are unchanged from April, though much more prominent than in 2011. Pancreatic atrophy is mostly new from 2011. 3. Mild nonspecific prominence of the intrahepatic biliary ducts; this may reflect the patient's baseline. Mild cholelithiasis noted. Gallbladder otherwise unremarkable. 4. Diffuse calcification along the abdominal aorta and its branches. 5. Stable chronic severe compression deformity of vertebral body L2, and mild chronic compression deformity involving the remaining lumbar vertebral bodies. 6. Mild bibasilar atelectasis and scarring noted. Electronically Signed   By: Roanna Raider M.D.   On: 02/19/2015 02:27    Positive ROS: All other systems have been reviewed and were otherwise negative with the exception of those mentioned in the HPI and as above.  Physical Exam: General:  Alert, no acute distress Psychiatric:  Patient is competent for consent with normal mood and affect   Cardiovascular:  No pedal edema Respiratory:  No wheezing, non-labored breathing GI:  Abdomen is soft and non-tender Skin:  No lesions in the area of chief complaint Neurologic:  Sensation intact distally Lymphatic:  No axillary or cervical lymphadenopathy  Orthopedic Exam:  Orthopedic examination is limited lumbar spine and lower extremities. The patient is  in bed and no attempt is made to get her out of bed. According the family, she is wheelchair dependent and does not ambulate on her own. Skin inspection of her lower back is unremarkable. She has an most mild tenderness to firm percussion over the lower lumbar spine centrally. She has no tenderness over either SI joint, nor over either trochanteric region. She is able to actively  flex and extend both hips, knees, ankles, and feet without pain. Sensation is intact to light touch in all distributions of both lower extremities. She exhibits 4+/5 strength with resisted dorsiflexion and plantarflexion of both ankles as well as EHL testing bilaterally. She has good capillary refill to both feet.  X-rays:  A recent CT scan of the pelvis and abdomen has been obtained and is available for review. By report, there is an old significant wedge compression fracture at L2 which appears to be unchanged in height as compared to prior studies. There also is some mild wedging at L4 and L5 with complete loss of the L4-L5 disc space. No new obvious compression fractures are noted. There is no evidence of spondylolysis or spondylolisthesis. Moderate-severe multilevel degenerative changes are noted.  Assessment: Increased lower back pain with history of L2 compression fracture and severe multilevel lumbar degenerative disc disease.  Plan: The treatment options are discussed with the patient and her family who was at the bedside. Although the patient does not complain of much pain at this time, she has been receiving pain medication during the day. In order to determine whether the patient might benefit from a kyphoplasty, the compression fracture has to be either acute or subacute. The CT scan does not provide this information. Therefore, I will order an MRI scan of the lumbar spine to see if there is any increased bone marrow edema at L2, as well as at L4 and/or L5, which also show some evidence of wedging. If there is any increased bone marrow edema in any of these levels, the patient might be a candidate for a kyphoplasty, in which case I would ask Dr. Rosita KeaMenz to see her early next week, as he is the person in our group who does the kyphoplasties.  Thank you for ask me to participate in the care of this most pleasant woman. I will be happy to follow her with you.   Maryagnes AmosJ. Jeffrey Poggi, MD  Beeper #:   [phone]  02/19/2015 5:52 PM

## 2015-02-19 NOTE — Plan of Care (Signed)
Problem: Consults Goal: Skin Care Protocol Initiated - if Braden Score 18 or less If consults are not indicated, leave blank or document N/A  Outcome: Progressing Applied Sacral Foam pad. Goal: Diabetes Guidelines if Diabetic/Glucose > 140 If diabetic or lab glucose is > 140 mg/dl - Initiate Diabetes/Hyperglycemia Guidelines & Document Interventions  Outcome: Not Progressing Pt had hypogylcemic episode today.  Problem: Phase I Progression Outcomes Goal: Pain controlled with appropriate interventions Outcome: Progressing New order for pain medications. Goal: Voiding-avoid urinary catheter unless indicated Voiding, occas. episodes of incontinence

## 2015-02-19 NOTE — Care Management Note (Signed)
Case Management Note  Patient Details  Name: ABBIE BERLING MRN: [id number] Date of Birth: 06-03-1930  Subjective/Objective:                  Met with patient and her daughter/caregiver to discuss discharge planning. Patient is from home alone for some hours of the day but her daughter spends several hours of the day with patient. Daughter is apparently receiving some pay through an organization to care for her mother. Patient was recently discharged from [facility]. She wants to return home but declined home health. She states she has depended on wheelchair now for the last 3-4 years. Her PCP is with [facility]  [phone]. She said she could ride in a private car.  Action/Plan: List of home health agencies left with patient/daugher. I attempted to contact [facility] for follow up appointment but there was no answer. No RNCM needs per patient.   Expected Discharge Date:                  Expected Discharge Plan:     In-House Referral:     Discharge planning Services  CM Consult  Post Acute Care Choice:  Home Health Choice offered to:  Patient  DME Arranged:    DME Agency:     HH Arranged:    [facility] Agency:     Status of Service:  In process, will continue to follow  Medicare Important Message Given:    Date Medicare IM Given:    Medicare IM give by:    Date Additional Medicare IM Given:    Additional Medicare Important Message give by:     If discussed at [place] of Stay Meetings, dates discussed:    Additional Comments:  Marshell Garfinkel, RN 02/19/2015, 9:25 AM

## 2015-02-19 NOTE — Progress Notes (Signed)
[facility] - [place] [facility]   PATIENT NAME: Tasha Grant    MR#:  [id number]  DATE OF BIRTH:  1930-05-12  SUBJECTIVE:  CHIEF COMPLAINT:   Chief Complaint  Patient presents with  . Back Pain   Patient here due to intractable back pain due to a previous compression fracture. Pain is a bit improved since yesterday. Denies any urine or fecal incontinence.    REVIEW OF SYSTEMS:    Review of Systems  Constitutional: Negative for fever and chills.  HENT: Negative for congestion and tinnitus.   Eyes: Negative for blurred vision and double vision.  Respiratory: Negative for cough, shortness of breath and wheezing.   Cardiovascular: Negative for chest pain, orthopnea and PND.  Gastrointestinal: Negative for nausea, vomiting, abdominal pain and diarrhea.  Genitourinary: Negative for dysuria and hematuria.  Musculoskeletal: Positive for back pain.  Neurological: Negative for dizziness, sensory change and focal weakness.  All other systems reviewed and are negative.   Nutrition: Heart healthy carb control Tolerating Diet: Yes Tolerating PT: She is wheelchair-bound at baseline     DRUG ALLERGIES:   Allergies  Allergen Reactions  . Codeine Itching  . Morphine And Related Itching  . Quinapril Other (See Comments)  . Sulfa Antibiotics Nausea Only    VITALS:  Blood pressure 131/69, pulse 68, temperature 98.4 F (36.9 C), temperature source Oral, resp. rate 18, height  (1.676 m), weight 63.912 kg (140 lb 14.4 oz), SpO2 94 %.  PHYSICAL EXAMINATION:   Physical Exam  GENERAL:  81 y.o.-year-old thin patient lying in the bed with no acute distress.  EYES: Pupils equal, round, reactive to light and accommodation. No scleral icterus. Extraocular muscles intact.  HEENT: Head atraumatic, normocephalic. Oropharynx and nasopharynx clear.  NECK:  Supple, no jugular venous distention. No thyroid enlargement, no tenderness.  LUNGS: Normal breath sounds  bilaterally, no wheezing, rales, rhonchi. No use of accessory muscles of respiration.  CARDIOVASCULAR: S1, S2 normal. No murmurs, rubs, or gallops.  ABDOMEN: Soft, nontender, nondistended. Bowel sounds present. No organomegaly or mass.  EXTREMITIES: No cyanosis, clubbing or edema b/l.    NEUROLOGIC: Cranial nerves II through XII are intact. No focal Motor or sensory deficits b/l. Globally weak   PSYCHIATRIC: The patient is alert and oriented x 3. Good affect.  SKIN: No obvious rash, lesion, or ulcer.    LABORATORY PANEL:   CBC  Recent Labs Lab 02/18/15 2245  WBC 11.8*  HGB 13.1  HCT 39.9  PLT 232   ------------------------------------------------------------------------------------------------------------------  Chemistries   Recent Labs Lab 02/18/15 2245  NA 135  K 4.7  CL 103  CO2 25  GLUCOSE 71  BUN 29*  CREATININE 1.50*  CALCIUM 8.7*  AST 33  ALT 16  ALKPHOS 139*  BILITOT 0.7   ------------------------------------------------------------------------------------------------------------------  Cardiac Enzymes No results for input(s): TROPONINI in the last 168 hours. ------------------------------------------------------------------------------------------------------------------  RADIOLOGY:  Ct Abdomen Pelvis W Contrast  02/19/2015  CLINICAL DATA:  Acute onset of severe lower bilateral back pain. Initial encounter. EXAM: CT ABDOMEN AND PELVIS WITH CONTRAST TECHNIQUE: Multidetector CT imaging of the abdomen and pelvis was performed using the standard protocol following bolus administration of intravenous contrast. CONTRAST:  72mL OMNIPAQUE IOHEXOL 300 MG/ML  SOLN COMPARISON:  CT of the lumbar spine performed 08/08/2014, and CT of the abdomen and pelvis performed 12/24/2009 FINDINGS: Mild bibasilar atelectasis and scarring are noted. There is mild nonspecific prominence of the intrahepatic biliary ducts. This may reflect the patient's baseline. A  few tiny stones are  noted dependently within the gallbladder. Minimal vascular calcifications are seen within the spleen. A few tiny calcified granulomata are noted within the left hepatic lobe. The liver is otherwise unremarkable. Calcification at the body of the pancreas may reflect sequelae of chronic pancreatitis. There appear to be several large stones within the pancreatic duct at the head of the pancreas, measuring up to 8 mm in size, with dilatation of the pancreatic duct to 8 mm in diameter. There is diffuse atrophy of the pancreas. The stones are unchanged from April, though much more prominent than in 2011. Pancreatic atrophy is mostly new from 2011. The adrenal glands are unremarkable in appearance. The kidneys are unremarkable in appearance. There is no evidence of hydronephrosis. No renal or ureteral stones are seen. No perinephric stranding is appreciated. No free fluid is identified. The small bowel is unremarkable in appearance. The stomach is within normal limits. No acute vascular abnormalities are seen. Diffuse calcification is noted along the abdominal aorta and its branches. The appendix is normal in caliber, without evidence of appendicitis. The colon is grossly unremarkable in appearance. The bladder is mildly distended and grossly unremarkable. The uterus is unremarkable. The ovaries are relatively symmetric. No suspicious adnexal masses are seen. No inguinal lymphadenopathy is seen. No acute osseous abnormalities are identified. The patient's left femoral hardware is grossly unremarkable, though incompletely imaged. There is a stable chronic severe compression deformity of vertebral body L2, and mild chronic compression deformity involving the remaining lumbar vertebral bodies. IMPRESSION: 1. No definite acute abnormality seen to explain the patient's symptoms. 2. Chronic obstruction of the pancreatic duct, with dilatation of the pancreatic duct to 8 mm in diameter, and diffuse atrophy of the pancreas. Several  large stones within the pancreatic duct at the head of the pancreas measure up to 8 mm in size. These are unchanged from April, though much more prominent than in 2011. Pancreatic atrophy is mostly new from 2011. 3. Mild nonspecific prominence of the intrahepatic biliary ducts; this may reflect the patient's baseline. Mild cholelithiasis noted. Gallbladder otherwise unremarkable. 4. Diffuse calcification along the abdominal aorta and its branches. 5. Stable chronic severe compression deformity of vertebral body L2, and mild chronic compression deformity involving the remaining lumbar vertebral bodies. 6. Mild bibasilar atelectasis and scarring noted. Electronically Signed   By: Roanna Raider M.D.   On: 02/19/2015 02:27     ASSESSMENT AND PLAN:   81 year old female with past medical history of dementia, chronic back pain, hypertension, hyperthyroidism, diabetes, COPD, who presented to the hospital due to intractable back pain.  #1 intractable back pain-this is likely secondary to a L2 compression fracture. Patient has an old compression fracture of her pain got progressively worse. -Continue supportive care with pain control with as needed Vicodin. -I will get orthopedic consult to see if she would benefit from a kyphoplasty.  #2 diabetes type 2 without complication-continue Lantus, sliding scale insulin. -Continue Tradjenta, Actos.  #3 hypothyroidism-continue Synthroid.  #4 history of dementia-continue Aricept.  #5 hyperlipidemia-continue atorvastatin.  #6 COPD-no acute exacerbation. Continue Pulmicort nebs, albuterol nebs when necessary - cont Flonase  #7 tobacco abuse-continue nicotine patch, Nicotrol Inhaler.  Possible d/c home tomorrow. She does not want Home health services.   All the records are reviewed and case discussed with Care Management/Social Workerr. Management plans discussed with the patient, family and they are in agreement.  CODE STATUS: Full  DVT Prophylaxis:  lovenox  TOTAL TIME TAKING CARE OF THIS PATIENT: 30  minutes.   POSSIBLE D/C IN 1-2 DAYS, DEPENDING ON CLINICAL CONDITION.   [place] SirenSAINANI,VIVEK J M.D on 02/19/2015 at 2:10 PM  Between 7am to 6pm - Pager - [phone]  After 6pm go to [email or web address] - password [email or web address] [facility]  [facility]  Office  [phone]  CC: Primary care physician; Hyman HopesBurns, Harriett P, MD

## 2015-02-19 NOTE — Progress Notes (Signed)
Pt. Hypoglycemic at 1630 BG check. Complains of slight jitteriness, states she is otherwise fine. Gave 8 oz. Orange juice p.o. Pt. Sitting up in bed. Plan to recheck in 15 mins.

## 2015-02-19 NOTE — Progress Notes (Signed)
Paged Dr. Cherlynn KaiserSainani regarding pain medication.  Patient still complaining of intense pain and questioned if patient could have frequency of pain medication changed from Q 6 hrs to Q 4 hrs.  Approved.  Pending Dr. Joice LoftsPoggi to come consult patient, as he was notified.

## 2015-02-19 NOTE — ED Provider Notes (Signed)
[facility] Emergency Department Provider Note  ____________________________________________  Time seen: 12:02 AM  I have reviewed the triage vital signs and the nursing notes.   HISTORY  Chief Complaint Back Pain     HPI Tasha Grant is a 81 y.o. female is with acute onset of 10 out of 10 sharp diffuse lower back pain with onset 2 days ago. 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 Patient states that the pain is in her spine and in the area of her kidneys. Patient denies any fever. Patient denies any stress or activity prior to the onset of discomfort states that she was on her bedpan at home. Patient denies any dysuria no fever no abdominal pain no nausea vomiting or diarrhea.      Past Medical History  Diagnosis Date  . Renal disorder     There are no active problems to display for this patient.   Past Surgical History  Procedure Laterality Date  . Tonsillectomy      Current Outpatient Rx  Name  Route  Sig  Dispense  Refill  . alendronate (FOSAMAX) 10 MG tablet   Oral   Take 1 tablet by mouth once a week.         Christie Beckers 60 METERED DOSES 220 MCG/INH inhaler   Inhalation   Inhale 2 puffs into the lungs daily.           Dispense as written.   Marland Kitchen atorvastatin (LIPITOR) 20 MG tablet   Oral   Take 1 tablet by mouth daily.         Marland Kitchen donepezil (ARICEPT) 10 MG tablet   Oral   Take 1 tablet by mouth daily.         . fluticasone (FLONASE) 50 MCG/ACT nasal spray   Each Nare   Place 2 sprays into both nostrils daily.         Marland Kitchen JANUVIA 50 MG tablet   Oral   Take 1 tablet by mouth daily.           Dispense as written.   Marland Kitchen LANTUS SOLOSTAR 100 UNIT/ML Solostar Pen   Subcutaneous   Inject 25 Units into the skin daily.           Dispense as written.   Marland Kitchen levothyroxine (SYNTHROID, LEVOTHROID) 75 MCG tablet   Oral   Take 1 tablet by mouth daily.          . pioglitazone (ACTOS) 45 MG tablet   Oral   Take 1 tablet by mouth daily.         . polyethylene glycol powder (GLYCOLAX/MIRALAX) powder   Oral   Take 17 g by mouth daily as needed.         . VENTOLIN HFA 108 (90 BASE) MCG/ACT inhaler   Inhalation   Inhale 2 puffs into the lungs every 4 (four) hours as needed.           Dispense as written.     Allergies Codeine; Morphine and related; Quinapril; and Sulfa antibiotics  No family history on file.  Social History Social History  Substance Use Topics  . Smoking status: Current Every Day Smoker  . Smokeless tobacco: None  . Alcohol Use: No    Review of Systems  Constitutional: Negative for fever. Eyes: Negative for visual changes. ENT: Negative for sore throat. Cardiovascular: Negative for chest pain. Respiratory: Negative for shortness  of breath. Gastrointestinal: Negative for abdominal pain, vomiting and diarrhea. Genitourinary: Negative for dysuria. Musculoskeletal: Positive for back pain. Skin: Negative for rash. Neurological: Negative for headaches, focal weakness or numbness.   10-point ROS otherwise negative.  ____________________________________________   PHYSICAL EXAM:  VITAL SIGNS: ED Triage Vitals  Enc Vitals Group     BP 02/18/15 2159 171/71 mmHg     Pulse Rate 02/18/15 2159 66     Resp 02/18/15 2159 20     Temp 02/18/15 2159 98.6 F (37 C)     Temp Source 02/18/15 2159 Oral     SpO2 02/18/15 2159 99 %     Weight 02/18/15 2159 145 lb (65.772 kg)     Height 02/18/15 2159  (1.676 m)     Head Cir --      Peak Flow --      Pain Score 02/18/15 2202 10     Pain Loc --      Pain Edu? --      Excl. in GC? --     Constitutional: Alert and oriented. Apparent distress Eyes: Conjunctivae are normal. PERRL. Normal extraocular movements. ENT   Head: Normocephalic and atraumatic.   Nose: No congestion/rhinnorhea.   Mouth/Throat: Mucous membranes are moist.   Neck:  No stridor. Hematological/Lymphatic/Immunilogical: No cervical lymphadenopathy. Cardiovascular: Normal rate, regular rhythm. Normal and symmetric distal pulses are present in all extremities. No murmurs, rubs, or gallops. Respiratory: Normal respiratory effort without tachypnea nor retractions. Breath sounds are clear and equal bilaterally. No wheezes/rales/rhonchi. Gastrointestinal: Soft and nontender. No distention. There is no CVA tenderness. Genitourinary: deferred Musculoskeletal: L1 and L2 pain with palpation Neurologic:  Normal speech and language. No gross focal neurologic deficits are appreciated. Speech is normal.  Skin:  Skin is warm, dry and intact. No rash noted. Psychiatric: Mood and affect are normal. Speech and behavior are normal. Patient exhibits appropriate insight and judgment.  ____________________________________________    LABS (pertinent positives/negatives)  Labs Reviewed  CBC WITH DIFFERENTIAL/PLATELET - Abnormal; Notable for the following:    WBC 11.8 (*)    Neutro Abs 7.1 (*)    Monocytes Absolute 1.0 (*)    All other components within normal limits  COMPREHENSIVE METABOLIC PANEL - Abnormal; Notable for the following:    BUN 29 (*)    Creatinine, Ser 1.50 (*)    Calcium 8.7 (*)    Alkaline Phosphatase 139 (*)    GFR calc non Af Amer 31 (*)    GFR calc Af Amer 36 (*)    All other components within normal limits  URINALYSIS COMPLETEWITH MICROSCOPIC ([facility] ONLY) - Abnormal; Notable for the following:    Color, Urine YELLOW (*)    APPearance CLEAR (*)    Hgb urine dipstick 2+ (*)    Squamous Epithelial / LPF 0-5 (*)    All other components within normal limits         RADIOLOGY     CT Abdomen Pelvis W Contrast (Final result) Result time: 02/19/15 02:27:49   Final result by Rad Results In Interface (02/19/15 02:27:49)   Narrative:   CLINICAL DATA: Acute onset of severe lower bilateral back pain. Initial encounter.  EXAM: CT ABDOMEN AND  PELVIS WITH CONTRAST  TECHNIQUE: Multidetector CT imaging of the abdomen and pelvis was performed using the standard protocol following bolus administration of intravenous contrast.  CONTRAST: 72mL OMNIPAQUE IOHEXOL 300 MG/ML SOLN  COMPARISON: CT of the lumbar spine performed 08/08/2014, and CT of the abdomen and pelvis  performed 12/24/2009  FINDINGS: Mild bibasilar atelectasis and scarring are noted.  There is mild nonspecific prominence of the intrahepatic biliary ducts. This may reflect the patient's baseline. A few tiny stones are noted dependently within the gallbladder. Minimal vascular calcifications are seen within the spleen. A few tiny calcified granulomata are noted within the left hepatic lobe. The liver is otherwise unremarkable.  Calcification at the body of the pancreas may reflect sequelae of chronic pancreatitis. There appear to be several large stones within the pancreatic duct at the head of the pancreas, measuring up to 8 mm in size, with dilatation of the pancreatic duct to 8 mm in diameter. There is diffuse atrophy of the pancreas. The stones are unchanged from April, though much more prominent than in 2011. Pancreatic atrophy is mostly new from 2011. The adrenal glands are unremarkable in appearance.  The kidneys are unremarkable in appearance. There is no evidence of hydronephrosis. No renal or ureteral stones are seen. No perinephric stranding is appreciated.  No free fluid is identified. The small bowel is unremarkable in appearance. The stomach is within normal limits. No acute vascular abnormalities are seen. Diffuse calcification is noted along the abdominal aorta and its branches.  The appendix is normal in caliber, without evidence of appendicitis. The colon is grossly unremarkable in appearance.  The bladder is mildly distended and grossly unremarkable. The uterus is unremarkable. The ovaries are relatively symmetric. No  suspicious adnexal masses are seen. No inguinal lymphadenopathy is seen.  No acute osseous abnormalities are identified. The patient's left femoral hardware is grossly unremarkable, though incompletely imaged. There is a stable chronic severe compression deformity of vertebral body L2, and mild chronic compression deformity involving the remaining lumbar vertebral bodies.  IMPRESSION: 1. No definite acute abnormality seen to explain the patient's symptoms. 2. Chronic obstruction of the pancreatic duct, with dilatation of the pancreatic duct to 8 mm in diameter, and diffuse atrophy of the pancreas. Several large stones within the pancreatic duct at the head of the pancreas measure up to 8 mm in size. These are unchanged from April, though much more prominent than in 2011. Pancreatic atrophy is mostly new from 2011. 3. Mild nonspecific prominence of the intrahepatic biliary ducts; this may reflect the patient's baseline. Mild cholelithiasis noted. Gallbladder otherwise unremarkable. 4. Diffuse calcification along the abdominal aorta and its branches. 5. Stable chronic severe compression deformity of vertebral body L2, and mild chronic compression deformity involving the remaining lumbar vertebral bodies. 6. Mild bibasilar atelectasis and scarring noted.   Electronically Signed By: Roanna RaiderJeffery Chang M.D. On: 02/19/2015 02:27             INITIAL IMPRESSION / ASSESSMENT AND PLAN / ED COURSE  Pertinent labs & imaging results that were available during my care of the patient were reviewed by me and considered in my medical decision making (see chart for details).  Patient history physical exam and CT scan consistent with intractable pain secondary to severe compression fracture of L2. Patient has received multiple doses of IV narcotic analgesic in the emergency department without pain resolution and with near mild improvement. As such patient will be admitted to the hospital  for intractable pain secondary to L2 compression fracture. Patient discussed with Dr. Robb Matarrtiz for hospital admission  ____________________________________________   FINAL CLINICAL IMPRESSION(S) / ED DIAGNOSES  Final diagnoses:  L2 vertebral fracture, closed, initial encounter  Intractable pain      Darci Currentandolph N Siara Gorder, MD 02/19/15 [phone]

## 2015-02-20 DIAGNOSIS — M4854XA Collapsed vertebra, not elsewhere classified, thoracic region, initial encounter for fracture: Secondary | ICD-10-CM | POA: Diagnosis not present

## 2015-02-20 LAB — GLUCOSE, CAPILLARY
GLUCOSE-CAPILLARY: 63 mg/dL — AB (ref 65–99)
GLUCOSE-CAPILLARY: 63 mg/dL — AB (ref 65–99)
GLUCOSE-CAPILLARY: 126 mg/dL — AB (ref 65–99)
Glucose-Capillary: 116 mg/dL — ABNORMAL HIGH (ref 65–99)
Glucose-Capillary: 106 mg/dL — ABNORMAL HIGH (ref 65–99)
Glucose-Capillary: 147 mg/dL — ABNORMAL HIGH (ref 65–99)

## 2015-02-20 LAB — CREATININE, SERUM
CREATININE: 1.41 mg/dL — AB (ref 0.44–1.00)
GFR, EST AFRICAN AMERICAN: 39 mL/min — AB (ref >60)
GFR, EST NON AFRICAN AMERICAN: 34 mL/min — AB (ref >60)

## 2015-02-20 NOTE — Progress Notes (Signed)
Physical Therapy Evaluation Patient Details Name: Tasha Grant MRN: [id number] DOB: 09-Aug-1930 Today's Date: 02/20/2015   History of Present Illness  Patient is an 81 y.o. female admitted for intractable back pain on 27 Oct. Acute T11 compression fx noted. Patient is bedbound at baseline. PMH of DM, hyperlipidemia, chronic renal disease, and osteoporosis.  Clinical Impression  Patient is willing to participate and demonstrate transfers upon evaluation. Daughter at bedside admits that she assists patient with transfers using total assistance. Patient able to move from bed to wheelchair with total assistance but required +2 assistance returning to bed due to elevation discrepancy. Patient did require assistance from CNA for toileting during evaluation, which she reportedly performs independently at home. PT discussed home equipment with patient and patient's daughter who both believe they could benefit from Artesian lift, slide board, and gait belts to ease the stress of transfers. Because patient is experiencing more pain, it is believed that she would benefit from increased assistance at home to prevent falls during ADLs. Patient and patient's family will benefit from recommended equipment to prevent injury in future.    Follow Up Recommendations Supervision/Assistance - 24 hour    Equipment Recommendations  Other (comment) (Hoyer lift; slide board; and gait belt)    Recommendations for Other Services       Precautions / Restrictions Precautions Precautions: Fall Restrictions Weight Bearing Restrictions: No      Mobility  Bed Mobility Overal bed mobility: Modified Independent             General bed mobility comments: Patient requires increased time to get to EOB but could do so with supervision utilizing trapeze and bed rails. Notable increase in pain.  Transfers Overall transfer level: Needs assistance   Transfers: Squat Pivot Transfers     Squat pivot transfers: Total  assist;+2 physical assistance;+2 safety/equipment     General transfer comment: PT able to perform total assist to w/c from hospital bed; however, +2 assistance needed to transfer back into bed from chair due to elevation difference. Patient requiring assistance to perform toileting from CNA.  Ambulation/Gait                Stairs            Wheelchair Mobility    Modified Rankin (Stroke Patients Only)       Balance                                             Pertinent Vitals/Pain Pain Assessment: 0-10 Pain Score: 2  Pain Location: Back Pain Descriptors / Indicators: Aching Pain Intervention(s): Limited activity within patient's tolerance;Monitored during session;Repositioned    Home Living Family/patient expects to be discharged to:: Private residence Living Arrangements: Alone Available Help at Discharge: Family;Available PRN/intermittently Type of Home: House Home Access: Ramped entrance     Home Layout: One level Home Equipment: Hospital bed;Transport chair;Bedside commode      Prior Function Level of Independence: Needs assistance   Gait / Transfers Assistance Needed: Patient is w/c bound. Total assistance to perform transfers to/from w/c or car.  ADL's / Homemaking Assistance Needed: Family assistance        Hand Dominance        Extremity/Trunk Assessment   Upper Extremity Assessment: Overall WFL for tasks assessed           Lower Extremity Assessment: RLE deficits/detail;LLE  deficits/detail RLE Deficits / Details: Generalized weakness; decreased knee extension B (~90 deg. flexion) with hard end feel LLE Deficits / Details: Generalized weakness; lacking terminal knee extension (~90 deg. flexion) with hard end feel     Communication   Communication: No difficulties  Cognition Arousal/Alertness: Awake/alert Behavior During Therapy: WFL for tasks assessed/performed Overall Cognitive Status: Within Functional  Limits for tasks assessed                      General Comments      Exercises        Assessment/Plan    PT Assessment Patent does not need any further PT services  PT Diagnosis Generalized weakness   PT Problem List    PT Treatment Interventions     PT Goals (Current goals can be found in the Care Plan section) Acute Rehab PT Goals Patient Stated Goal: "To get rid of this back pain" PT Goal Formulation: With patient/family Time For Goal Achievement: 03/06/15 Potential to Achieve Goals: Good    Frequency     Barriers to discharge        Co-evaluation               End of Session   Activity Tolerance: Patient limited by pain Patient left: in bed;with call bell/phone within reach;with bed alarm set;with family/visitor present      Functional Limitation: Mobility: Walking and moving around Mobility: Walking and Moving Around Current Status ([id number]): At least 80 percent but less than 100 percent impaired, limited or restricted Mobility: Walking and Moving Around Goal Status [phone]([id number]): At least 80 percent but less than 100 percent impaired, limited or restricted Mobility: Walking and Moving Around Discharge Status [phone]([id number]): At least 80 percent but less than 100 percent impaired, limited or restricted    Time: 1330-1412 PT Time Calculation (min) (ACUTE ONLY): 42 min   Charges:   PT Evaluation $Initial PT Evaluation Tier I: 1 Procedure PT Treatments $Therapeutic Activity: 8-22 mins   PT G Codes:   PT G-Codes **NOT FOR INPATIENT CLASS** Functional Limitation: Mobility: Walking and moving around Mobility: Walking and Moving Around Current Status ([id number]): At least 80 percent but less than 100 percent impaired, limited or restricted Mobility: Walking and Moving Around Goal Status [phone]): At least 80 percent but less than 100 percent impaired, limited or restricted Mobility: Walking and Moving Around Discharge Status [phone]): At least 80 percent but less than  100 percent impaired, limited or restricted    Neita CarpJulie Ann Keiarah Orlowski, PT, DPT 02/20/2015, 3:12 PM

## 2015-02-20 NOTE — Progress Notes (Signed)
Pt has had two episodes low blood sugar one at breakfast the other at lunch. Both of her values were at 63 both recovered into the 120's after she drank orange juice. Pt was and has remained asymptomatic.

## 2015-02-20 NOTE — Progress Notes (Signed)
Patient ID: Tasha Grant, female   DOB: 1930/05/02, 81 y.o.   MRN: [id number] [facility] PROGRESS NOTE  PCP: Hyman Hopes, MD  HPI/Subjective: Patient having back pain. More severe now. She's had a history of back pain and has not walked in years. She is wheelchair bound.  Objective: Filed Vitals:   02/20/15 0752  BP: 170/63  Pulse: 77  Temp: 98.4 F (36.9 C)  Resp: 18    Filed Weights   02/18/15 2159 02/19/15 0655  Weight: 65.772 kg (145 lb) 63.912 kg (140 lb 14.4 oz)    ROS: Review of Systems  Constitutional: Negative for fever and chills.  Eyes: Negative for blurred vision.  Respiratory: Negative for cough and shortness of breath.   Cardiovascular: Negative for chest pain.  Gastrointestinal: Negative for nausea, vomiting, diarrhea and constipation.  Genitourinary: Negative for dysuria.  Musculoskeletal: Positive for back pain. Negative for joint pain.  Neurological: Negative for dizziness and headaches.   Exam: Physical Exam  HENT:  Nose: No mucosal edema.  Mouth/Throat: No oropharyngeal exudate or posterior oropharyngeal edema.  Eyes: Conjunctivae, EOM and lids are normal. Pupils are equal, round, and reactive to light.  Neck: No JVD present. Carotid bruit is not present. No edema present. No thyroid mass and no thyromegaly present.  Cardiovascular: S1 normal and S2 normal.  Exam reveals no gallop.   No murmur heard. Pulses:      Dorsalis pedis pulses are 2+ on the right side, and 2+ on the left side.  Respiratory: No respiratory distress. She has no wheezes. She has no rhonchi. She has no rales.  GI: Soft. Bowel sounds are normal. There is no tenderness.  Musculoskeletal:       Right ankle: She exhibits swelling.       Left ankle: She exhibits swelling.  Lymphadenopathy:    She has no cervical adenopathy.  Neurological: She is alert. No cranial nerve deficit.  Patient unable to straighten her legs out all the way. She is able to straight  leg raise bilaterally.  Skin: Skin is warm. Bruising noted. Nails show no clubbing.  Psychiatric: She has a normal mood and affect.    Data Reviewed: Basic Metabolic Panel:  Recent Labs Lab 02/18/15 2245 02/20/15 0305  NA 135  --   K 4.7  --   CL 103  --   CO2 25  --   GLUCOSE 71  --   BUN 29*  --   CREATININE 1.50* 1.41*  CALCIUM 8.7*  --    Liver Function Tests:  Recent Labs Lab 02/18/15 2245  AST 33  ALT 16  ALKPHOS 139*  BILITOT 0.7  PROT 7.7  ALBUMIN 3.5   CBC:  Recent Labs Lab 02/18/15 2245  WBC 11.8*  NEUTROABS 7.1*  HGB 13.1  HCT 39.9  MCV 90.1  PLT 232    CBG:  Recent Labs Lab 02/19/15 1623 02/19/15 1646 02/19/15 2020 02/20/15 0714 02/20/15 0751  GLUCAP 53*  46* 80 88 63* 116*      Studies: Mr Lumbar Spine Wo Contrast  02/19/2015  CLINICAL DATA:  Severe back pain. Chronic compression fracture at L2. Pain worsened approximately 2 days ago. EXAM: MRI LUMBAR SPINE WITHOUT CONTRAST TECHNIQUE: Multiplanar, multisequence MR imaging of the lumbar spine was performed. No intravenous contrast was administered. COMPARISON:  CT abdomen and pelvis 02/19/2015. MRI lumbar spine 01/10/2011. FINDINGS: The scan is moderately motion degraded; small or subtle lesions could be overlooked. There is straightening of the  normal lumbar lordosis due to chronic degenerative change. There is a severe, remote, L2 compression deformity, with anterior wedging and near complete loss of vertebral body height. Retropulsion of bone and/or disc osteophyte complex projects posteriorly into the spinal canal. In conjunction with posterior element hypertrophy, there is severe spinal stenosis and foraminal narrowing affecting the L1 and L2 nerve roots. This L2 compression fracture has significantly progressed since 2012. Minor disc disease at L2-L3. Advanced degenerative disc disease at L3-4, with asymmetric loss of interspace height to the LEFT and osseous spurring. Disc material  and disc osteophyte complex extending into the subarticular zone and foraminal zone in conjunction with facet arthropathy result in moderate stenosis as well as left-sided neural impingement of the L3 and L4 nerve roots. At L4-5, there is near complete loss of interspace height with deformity of the L4 and L5 vertebral bodies. No features to currently suggest an active process, but the appearance would be consistent with a remote diskitis and osteomyelitis at this level. Slight T2 and STIR hyperintensity of the residual disc is non worrisome. There is extensive posterior element hypertrophy and disc osteophyte complex which is worse on the RIGHT resulting in severe stenosis and RIGHT greater than LEFT neural impingement. The L5-S1 disc appears unremarkable. On motion degraded sagittal images, there is abnormal wedging of the T11 vertebral body, approximately 25% loss of height. This vertebral body was not examined with axial images, but on sagittal STIR imaging, there appears to be bone marrow edema of T11. There may be a small osteonecrotic cleft anteriorly and superiorly adjacent to the T10-11 disc space. Findings are consistent with an acute T11 compression fracture. This abnormality was not present on previous MR from 01/10/2011. IMPRESSION: Severe compression fracture at L2 which was acute in 2012. No residual bone marrow edema, but significant retropulsion of bone and disc osteophyte complex at the L1-2 interspace results in severe spinal stenosis. Abnormal wedging of the T11 vertebral body, not present on prior MR, with suspected bone marrow edema. Findings consistent with an acute T11 compression fracture. This was not present on prior MR. Significant degenerative disc disease at L3-4 on the LEFT with disc material and osseous ridging resulting in stenosis and foraminal narrowing which are predominantly left-sided. Severe stenosis at L4-5 is multifactorial, related to disc osteophyte complex and posterior  element hypertrophy. The appearance of this interspace and adjacent vertebral bodies is suggestive of prior infection, but no features are seen to suggest acute inflammation. Electronically Signed   By: Elsie Stain M.D.   On: 02/19/2015 21:33   Ct Abdomen Pelvis W Contrast  02/19/2015  CLINICAL DATA:  Acute onset of severe lower bilateral back pain. Initial encounter. EXAM: CT ABDOMEN AND PELVIS WITH CONTRAST TECHNIQUE: Multidetector CT imaging of the abdomen and pelvis was performed using the standard protocol following bolus administration of intravenous contrast. CONTRAST:  72mL OMNIPAQUE IOHEXOL 300 MG/ML  SOLN COMPARISON:  CT of the lumbar spine performed 08/08/2014, and CT of the abdomen and pelvis performed 12/24/2009 FINDINGS: Mild bibasilar atelectasis and scarring are noted. There is mild nonspecific prominence of the intrahepatic biliary ducts. This may reflect the patient's baseline. A few tiny stones are noted dependently within the gallbladder. Minimal vascular calcifications are seen within the spleen. A few tiny calcified granulomata are noted within the left hepatic lobe. The liver is otherwise unremarkable. Calcification at the body of the pancreas may reflect sequelae of chronic pancreatitis. There appear to be several large stones within the pancreatic duct at the  head of the pancreas, measuring up to 8 mm in size, with dilatation of the pancreatic duct to 8 mm in diameter. There is diffuse atrophy of the pancreas. The stones are unchanged from April, though much more prominent than in 2011. Pancreatic atrophy is mostly new from 2011. The adrenal glands are unremarkable in appearance. The kidneys are unremarkable in appearance. There is no evidence of hydronephrosis. No renal or ureteral stones are seen. No perinephric stranding is appreciated. No free fluid is identified. The small bowel is unremarkable in appearance. The stomach is within normal limits. No acute vascular abnormalities are  seen. Diffuse calcification is noted along the abdominal aorta and its branches. The appendix is normal in caliber, without evidence of appendicitis. The colon is grossly unremarkable in appearance. The bladder is mildly distended and grossly unremarkable. The uterus is unremarkable. The ovaries are relatively symmetric. No suspicious adnexal masses are seen. No inguinal lymphadenopathy is seen. No acute osseous abnormalities are identified. The patient's left femoral hardware is grossly unremarkable, though incompletely imaged. There is a stable chronic severe compression deformity of vertebral body L2, and mild chronic compression deformity involving the remaining lumbar vertebral bodies. IMPRESSION: 1. No definite acute abnormality seen to explain the patient's symptoms. 2. Chronic obstruction of the pancreatic duct, with dilatation of the pancreatic duct to 8 mm in diameter, and diffuse atrophy of the pancreas. Several large stones within the pancreatic duct at the head of the pancreas measure up to 8 mm in size. These are unchanged from April, though much more prominent than in 2011. Pancreatic atrophy is mostly new from 2011. 3. Mild nonspecific prominence of the intrahepatic biliary ducts; this may reflect the patient's baseline. Mild cholelithiasis noted. Gallbladder otherwise unremarkable. 4. Diffuse calcification along the abdominal aorta and its branches. 5. Stable chronic severe compression deformity of vertebral body L2, and mild chronic compression deformity involving the remaining lumbar vertebral bodies. 6. Mild bibasilar atelectasis and scarring noted. Electronically Signed   By: Roanna RaiderJeffery  Chang M.D.   On: 02/19/2015 02:27    Scheduled Meds: . atorvastatin  20 mg Oral Daily  . budesonide (PULMICORT) nebulizer solution  0.25 mg Nebulization BID  . docusate sodium  100 mg Oral BID  . donepezil  10 mg Oral Daily  . enoxaparin (LOVENOX) injection  30 mg Subcutaneous Q24H  . fluticasone  2 spray  Each Nare Daily  . insulin aspart  0-9 Units Subcutaneous TID WC  . insulin glargine  25 Units Subcutaneous Daily  . levothyroxine  75 mcg Oral Daily  . linagliptin  5 mg Oral Daily  . pioglitazone  45 mg Oral Daily    Assessment/Plan:  1. Acute T11 compression fracture. Orthopedic surgery will consult Dr. Sherrell PullerMendez on Monday for assessment on whether the patient would be a candidate for kyphoplasty. Pain control with oral medications. Physical therapy evaluation. 2. Spinal stenosis is severe at multiple levels. Patient has not walked in years. This is likely the cause of her not walking. 3. Type 2 diabetes without complication- continue oral medications and Lantus insulin. I will decrease the dose of the insulin. 4. Hyperlipidemia unspecified continue atorvastatin 5. Hypothyroidism unspecified continue levothyroxine 6. Chronic kidney disease stage III  Code Status:     Code Status Orders        Start     Ordered   02/19/15 0742  Full code   Continuous     02/19/15 0741     Family Communication: Son on the phone Disposition Plan:  To be determined  Consultants:  Orthopedic surgery  Physical therapy  Time spent: 25 minutes  Alford Highland  [facility]

## 2015-02-20 NOTE — Progress Notes (Signed)
Spoke with Dr Renae GlossWieting regarding pt's continued low blood sugars pointing out her daily lantis order of 25 units. Lantis discontinued per Dr Renae GlossWieting.

## 2015-02-20 NOTE — Progress Notes (Signed)
Subjective: No new complaints. Her back is sore but not excruciating. However, she has not gotten up out of bed yet. Her pain is well-controlled with her present pain medication regimen.   Objective: Vital signs in last 24 hours: Temp:  [98 F (36.7 C)-98.9 F (37.2 C)] 98.4 F (36.9 C) (10/29 0752) Pulse Rate:  [63-77] 77 (10/29 0752) Resp:  [18] 18 (10/29 0752) BP: (135-173)/(49-73) 170/63 mmHg (10/29 0752) SpO2:  [91 %-100 %] 100 % (10/29 0758)  Intake/Output from previous day: 10/28 0701 - 10/29 0700 In: 360 [P.O.:360] Out: -  Intake/Output this shift:     Recent Labs  02/18/15 2245  HGB 13.1    Recent Labs  02/18/15 2245  WBC 11.8*  RBC 4.43  HCT 39.9  PLT 232    Recent Labs  02/18/15 2245 02/20/15 0305  NA 135  --   K 4.7  --   CL 103  --   CO2 25  --   BUN 29*  --   CREATININE 1.50* 1.41*  GLUCOSE 71  --   CALCIUM 8.7*  --    No results for input(s): LABPT, INR in the last 72 hours.  Physical Exam: Physical exam findings are unchanged as compared to yesterday evening. She remains neurovascularly intact to both lower extremities.  X-rays: A recent MRI scan of the lumbar spine has been obtained and is available for review. By report, the MRI scan demonstrates evidence of increased bone marrow edema within the vertebral body of T11, consistent with an acute or subacute compression fracture. The MRI scan also demonstrates a significant old compression fracture at L2 without evidence of any further acute compression but resulting in severe lumbar stenosis at L1-2. MRI scan also reveals severe lumbar stenosis at L4-5 due to significant degenerative disc disease at this level, possibly secondary to an old discitis. Again, no acute processes are noted in this area.  Assessment: Acute exacerbation of chronic lower back pain secondary to apparent T11 compression fracture with underlying severe lumbar stenosis at L1-2 and at L4-5 due to chronic  changes  Plan: The treatment options are discussed with the patient. Based on the MRI findings, there does appear to be evidence of a new compression fracture at T11. Therefore, I will ask Dr. Rosita KeaMenz to evaluate this patient on Monday morning to see if he feels she would be a good candidate for a kyphoplasty. Regarding the more chronic changes at L1-2 and at L4-5, symptoms from these changes would need to be treated either by epidural steroid injections or potentially by a surgical decompression, although I am not sure how good a surgical candidate the patient would be. Meanwhile, the patient can be mobilized with physical therapy. This would consist primarily of getting from her bed to her wheelchair, and would serve as a good test to determine her level of pain.   Excell SeltzerJohn J Graceland Wachter 02/20/2015, 9:14 AM

## 2015-02-21 DIAGNOSIS — M4854XA Collapsed vertebra, not elsewhere classified, thoracic region, initial encounter for fracture: Secondary | ICD-10-CM | POA: Diagnosis not present

## 2015-02-21 LAB — GLUCOSE, CAPILLARY
GLUCOSE-CAPILLARY: 105 mg/dL — AB (ref 65–99)
GLUCOSE-CAPILLARY: 101 mg/dL — AB (ref 65–99)
GLUCOSE-CAPILLARY: 116 mg/dL — AB (ref 65–99)
Glucose-Capillary: 152 mg/dL — ABNORMAL HIGH (ref 65–99)

## 2015-02-21 NOTE — Progress Notes (Signed)
Patient ID: Tasha Grant, female   DOB: [birth year], 81 y.o.   MRN: [id number] [facility] PROGRESS NOTE  PCP: Hyman Hopes, MD  HPI/Subjective: Patient still having back pain. States the medication is helping a little bit.  Objective: Filed Vitals:   02/21/15 0724  BP: 139/52  Pulse: 65  Temp:   Resp: 16    Filed Weights   02/18/15 2159 02/19/15 0655  Weight: 65.772 kg (145 lb) 63.912 kg (140 lb 14.4 oz)    ROS: Review of Systems  Constitutional: Negative for fever and chills.  Eyes: Negative for blurred vision.  Respiratory: Negative for cough and shortness of breath.   Cardiovascular: Negative for chest pain.  Gastrointestinal: Negative for nausea, vomiting, diarrhea and constipation.  Genitourinary: Negative for dysuria.  Musculoskeletal: Positive for back pain. Negative for joint pain.  Neurological: Negative for dizziness and headaches.   Exam: Physical Exam  HENT:  Nose: No mucosal edema.  Mouth/Throat: No oropharyngeal exudate or posterior oropharyngeal edema.  Eyes: Conjunctivae, EOM and lids are normal. Pupils are equal, round, and reactive to light.  Neck: No JVD present. Carotid bruit is not present. No edema present. No thyroid mass and no thyromegaly present.  Cardiovascular: S1 normal and S2 normal.  Exam reveals no gallop.   No murmur heard. Pulses:      Dorsalis pedis pulses are 2+ on the right side, and 2+ on the left side.  Respiratory: No respiratory distress. She has no wheezes. She has no rhonchi. She has no rales.  GI: Soft. Bowel sounds are normal. There is no tenderness.  Musculoskeletal:       Right ankle: She exhibits swelling.       Left ankle: She exhibits swelling.  Lymphadenopathy:    She has no cervical adenopathy.  Neurological: She is alert. No cranial nerve deficit.  Patient unable to straighten her legs out all the way. She is able to straight leg raise bilaterally.  Skin: Skin is warm. Bruising noted. Nails  show no clubbing.  Psychiatric: She has a normal mood and affect.    Data Reviewed: Basic Metabolic Panel:  Recent Labs Lab 02/18/15 2245 02/20/15 0305  NA 135  --   K 4.7  --   CL 103  --   CO2 25  --   GLUCOSE 71  --   BUN 29*  --   CREATININE 1.50* 1.41*  CALCIUM 8.7*  --    Liver Function Tests:  Recent Labs Lab 02/18/15 2245  AST 33  ALT 16  ALKPHOS 139*  BILITOT 0.7  PROT 7.7  ALBUMIN 3.5   CBC:  Recent Labs Lab 02/18/15 2245  WBC 11.8*  NEUTROABS 7.1*  HGB 13.1  HCT 39.9  MCV 90.1  PLT 232    CBG:  Recent Labs Lab 02/20/15 1204 02/20/15 1628 02/20/15 2114 02/21/15 0726 02/21/15 1117  GLUCAP 126* 106* 147* 105* 101*      Studies: Mr Lumbar Spine Wo Contrast  02/19/2015  CLINICAL DATA:  Severe back pain. Chronic compression fracture at L2. Pain worsened approximately 2 days ago. EXAM: MRI LUMBAR SPINE WITHOUT CONTRAST TECHNIQUE: Multiplanar, multisequence MR imaging of the lumbar spine was performed. No intravenous contrast was administered. COMPARISON:  CT abdomen and pelvis 02/19/2015. MRI lumbar spine 01/10/2011. FINDINGS: The scan is moderately motion degraded; small or subtle lesions could be overlooked. There is straightening of the normal lumbar lordosis due to chronic degenerative change. There is a severe, remote, L2 compression deformity,  with anterior wedging and near complete loss of vertebral body height. Retropulsion of bone and/or disc osteophyte complex projects posteriorly into the spinal canal. In conjunction with posterior element hypertrophy, there is severe spinal stenosis and foraminal narrowing affecting the L1 and L2 nerve roots. This L2 compression fracture has significantly progressed since 2012. Minor disc disease at L2-L3. Advanced degenerative disc disease at L3-4, with asymmetric loss of interspace height to the LEFT and osseous spurring. Disc material and disc osteophyte complex extending into the subarticular zone and  foraminal zone in conjunction with facet arthropathy result in moderate stenosis as well as left-sided neural impingement of the L3 and L4 nerve roots. At L4-5, there is near complete loss of interspace height with deformity of the L4 and L5 vertebral bodies. No features to currently suggest an active process, but the appearance would be consistent with a remote diskitis and osteomyelitis at this level. Slight T2 and STIR hyperintensity of the residual disc is non worrisome. There is extensive posterior element hypertrophy and disc osteophyte complex which is worse on the RIGHT resulting in severe stenosis and RIGHT greater than LEFT neural impingement. The L5-S1 disc appears unremarkable. On motion degraded sagittal images, there is abnormal wedging of the T11 vertebral body, approximately 25% loss of height. This vertebral body was not examined with axial images, but on sagittal STIR imaging, there appears to be bone marrow edema of T11. There may be a small osteonecrotic cleft anteriorly and superiorly adjacent to the T10-11 disc space. Findings are consistent with an acute T11 compression fracture. This abnormality was not present on previous MR from 01/10/2011. IMPRESSION: Severe compression fracture at L2 which was acute in 2012. No residual bone marrow edema, but significant retropulsion of bone and disc osteophyte complex at the L1-2 interspace results in severe spinal stenosis. Abnormal wedging of the T11 vertebral body, not present on prior MR, with suspected bone marrow edema. Findings consistent with an acute T11 compression fracture. This was not present on prior MR. Significant degenerative disc disease at L3-4 on the LEFT with disc material and osseous ridging resulting in stenosis and foraminal narrowing which are predominantly left-sided. Severe stenosis at L4-5 is multifactorial, related to disc osteophyte complex and posterior element hypertrophy. The appearance of this interspace and adjacent  vertebral bodies is suggestive of prior infection, but no features are seen to suggest acute inflammation. Electronically Signed   By: Elsie StainJohn T Curnes M.D.   On: 02/19/2015 21:33    Scheduled Meds: . atorvastatin  20 mg Oral Daily  . budesonide (PULMICORT) nebulizer solution  0.25 mg Nebulization BID  . docusate sodium  100 mg Oral BID  . donepezil  10 mg Oral Daily  . enoxaparin (LOVENOX) injection  30 mg Subcutaneous Q24H  . fluticasone  2 spray Each Nare Daily  . insulin aspart  0-9 Units Subcutaneous TID WC  . levothyroxine  75 mcg Oral Daily  . linagliptin  5 mg Oral Daily  . pioglitazone  45 mg Oral Daily    Assessment/Plan:  1. Acute T11 compression fracture. Orthopedic surgery will consult Dr. Rosita KeaMenz on Monday for assessment on whether the patient would be a candidate for kyphoplasty. Pain control with oral medications. Physical therapy evaluation appreciated 2. Spinal stenosis is severe at multiple levels. Patient has not walked in years. This is likely the cause of her not walking. 3. Type 2 diabetes without complication- continue oral medications and Lantus insulin. I will decrease the dose of the insulin. 4. Hyperlipidemia unspecified continue  atorvastatin 5. Hypothyroidism unspecified continue levothyroxine 6. Chronic kidney disease stage III  Code Status:     Code Status Orders        Start     Ordered   02/19/15 0742  Full code   Continuous     02/19/15 0741     Family Communication: Son yesterday Disposition Plan: Home with home health. Potentially Monday if no surgery planned. Potentially after kyphoplasty if planned.  Consultants:  Orthopedic surgery  Physical therapy  Time spent: 20 minutes  Alford Highland  [facility]

## 2015-02-21 NOTE — Progress Notes (Signed)
Using kpad to mid back for relief . incontinent of urine and stool  Has brief on Has difficulty moving in bed  without assist. Uses wheelchair at home . bedbound daughter resides with patient .

## 2015-02-21 NOTE — Care Management Important Message (Signed)
Important Message  Patient Details  Name: Tasha Grant MRN: [id number] Date of Birth: Jun 18, 1930   Medicare Important Message Given:  Yes-second notification given    [place] Doster A, RN 02/21/2015, 10:31 AM

## 2015-02-22 ENCOUNTER — Observation Stay: Payer: Medicare Other

## 2015-02-22 DIAGNOSIS — M4854XA Collapsed vertebra, not elsewhere classified, thoracic region, initial encounter for fracture: Secondary | ICD-10-CM | POA: Diagnosis not present

## 2015-02-22 DIAGNOSIS — Z01818 Encounter for other preprocedural examination: Secondary | ICD-10-CM

## 2015-02-22 LAB — GLUCOSE, CAPILLARY
GLUCOSE-CAPILLARY: 178 mg/dL — AB (ref 65–99)
GLUCOSE-CAPILLARY: 105 mg/dL — AB (ref 65–99)
Glucose-Capillary: 111 mg/dL — ABNORMAL HIGH (ref 65–99)
Glucose-Capillary: 207 mg/dL — ABNORMAL HIGH (ref 65–99)

## 2015-02-22 LAB — SURGICAL PCR SCREEN
MRSA, PCR: NEGATIVE
Staphylococcus aureus: NEGATIVE

## 2015-02-22 MED ORDER — NICOTINE 21 MG/24HR TD PT24
21.0000 mg | MEDICATED_PATCH | Freq: Every day | TRANSDERMAL | Status: DC
  Administered 2015-02-22 – 2015-02-23 (×2): 21 mg via TRANSDERMAL
  Filled 2015-02-22 (×3): qty 1

## 2015-02-22 MED ORDER — CEFAZOLIN (ANCEF) 1 G IV SOLR
1.0000 g | INTRAVENOUS | Status: AC
  Administered 2015-02-23: 1 g
  Filled 2015-02-22 (×2): qty 1

## 2015-02-22 NOTE — Progress Notes (Addendum)
Physical Therapy Treatment Patient Details Name: SARON VANORMAN MRN: [id number] DOB: 1931-02-23 Today's Date: 02/22/2015    History of Present Illness Patient is an 81 y.o. female admitted for intractable back pain on 27 Oct. Acute T11 compression fx noted. Patient is bedbound at baseline. PMH of DM, hyperlipidemia, chronic renal disease, and osteoporosis.    PT Comments    Patient appears to be at her baseline with bed mobility, however she is not able to tolerate sitting for more than 30-60 seconds before requesting to return to supine. Patient is able to complete manually resisted UE exercises, however at this time she defers all OOB mobility secondary to lower back pain. She states she will attempt transfer bed to chair (her highest level of mobility at home) after her injection tomorrow. Patient has contractures bilaterally and has been limited to transfers only for mobility. Skilled PT services are indicated to address patient's deficits with transfers.   Follow Up Recommendations  Supervision/Assistance - 24 hour     Equipment Recommendations  Other (comment) (Lift, gait belt, slide board)    Recommendations for Other Services       Precautions / Restrictions Precautions Precautions: Fall Restrictions Weight Bearing Restrictions: No    Mobility  Bed Mobility Overal bed mobility: Modified Independent             General bed mobility comments: Patient uses bed rail to assist with supine to sit transfer and no assistance required to return to supine.   Transfers                 General transfer comment: Patient declined any attempts at OOB mobility.   Ambulation/Gait                 Stairs            Wheelchair Mobility    Modified Rankin (Stroke Patients Only)       Balance Overall balance assessment: Needs assistance Sitting-balance support: Bilateral upper extremity supported;Feet supported Sitting balance-Leahy Scale: Good Sitting  balance - Comments: No balance deficits noted, however she prefers to be in supine as sitting is painful for her.                             Cognition Arousal/Alertness: Awake/alert Behavior During Therapy: WFL for tasks assessed/performed Overall Cognitive Status: Within Functional Limits for tasks assessed (Patient mentioned that she uses "her 2# dumbbells for arm exercises" on 3-4 separate occasions. )                      Exercises Other Exercises Other Exercises: Manually resisted chest press x 10 for 2 sets to promote UE strength for transfers  Other Exercises: SLR's with legs in maximal extension for her x 10 bilaterally  Other Exercises: Knees to chest x 10 bilaterally     General Comments General comments (skin integrity, edema, etc.): Patient has contractures bilaterally at her knees roughly 30-40 degrees.       Pertinent Vitals/Pain Pain Assessment: 0-10 Pain Score: 2  Pain Location: Lower back Pain Descriptors / Indicators: Aching Pain Intervention(s): Limited activity within patient's tolerance;Monitored during session;Repositioned;Utilized relaxation techniques    Home Living                      Prior Function            PT Goals (current goals can now be  found in the care plan section) Acute Rehab PT Goals Patient Stated Goal: "To get rid of this back pain" PT Goal Formulation: With patient/family Time For Goal Achievement: 03/06/15 Potential to Achieve Goals: Good Progress towards PT goals: Progressing toward goals    Frequency  Min 2X/week    PT Plan Current plan remains appropriate    Co-evaluation             End of Session   Activity Tolerance: Patient limited by pain;Patient tolerated treatment well Patient left: in bed;with call bell/phone within reach;with bed alarm set;with family/visitor present     Time: [id number] PT Time Calculation (min) (ACUTE ONLY): 15 min  Charges:  $Therapeutic Exercise: 8-22  mins                    G Codes:  Functional Assessment Tool Used: Clinical judgement  Functional Limitation: Mobility: Walking and moving around Mobility: Walking and Moving Around Current Status ([id number]): At least 80 percent but less than 100 percent impaired, limited or restricted Mobility: Walking and Moving Around Goal Status [phone]): At least 80 percent but less than 100 percent impaired, limited or restricted   Kerin RansomPatrick A Keondre Markson, PT, DPT    02/22/2015, 3:21 PM

## 2015-02-22 NOTE — Progress Notes (Addendum)
Patient seen at Dr. Binnie RailPoggi's request regarding possible kyphoplasty at T11. Patient reports sliding off a bed and chair at home landing on her bottom causing apparent fracture. She has prior compression fractures that healed but on her MRI the T11 appears to be a subacute fracture. On exam she is exquisitely tender at T11. I discussed treatment options including kyphoplasty and reviewed a bone model as well as the pressure being left with the patient. I'll return this afternoon hopefully her family will be her neck and discussed with them and we could schedule her for tomorrow for kyphoplasty at T11 try to get her mobilized by providing pain relief of the fracture.     Discussed with patient and family again and will proceed with T11 kyphoplasty tomorrowof alleviating her pain and allowing her to be discharged. Percent of its possible competitions discussed

## 2015-02-22 NOTE — Progress Notes (Signed)
Patient ID: Tasha SprinklesMamie L Ballowe, female   DOB: 1931-02-15, 81 y.o.   MRN: [id number] [facility] PRO[facility]ESS [facility]  PCP: Hyman HopesBurns, Harriett P, MD  HPI/Subjective: Patient still having back pain. Has been taking oral pain medication. Agreed for kyphoplasty tomorrow.  Objective: Filed Vitals:   02/22/15 0712  BP: 126/46  Pulse: 72  Temp: 99.1 F (37.3 C)  Resp: 14    Filed Weights   02/18/15 2159 02/19/15 0655  Weight: 65.772 kg (145 lb) 63.912 kg (140 lb 14.4 oz)    ROS: Review of Systems  Constitutional: Negative for fever and chills.  Eyes: Negative for blurred vision.  Respiratory: Negative for cough and shortness of breath.   Cardiovascular: Negative for chest pain.  Gastrointestinal: Negative for nausea, vomiting, diarrhea and constipation.  Genitourinary: Negative for dysuria.  Musculoskeletal: Positive for back pain. Negative for joint pain.  Neurological: Negative for dizziness and headaches.   Exam: Physical Exam  HENT:  Nose: No mucosal edema.  Mouth/Throat: No oropharyngeal exudate or posterior oropharyngeal edema.  Eyes: Conjunctivae, EOM and lids are normal. Pupils are equal, round, and reactive to light.  Neck: No JVD present. Carotid bruit is not present. No edema present. No thyroid mass and no thyromegaly present.  Cardiovascular: S1 normal and S2 normal.  Exam reveals no gallop.   No murmur heard. Pulses:      Dorsalis pedis pulses are 2+ on the right side, and 2+ on the left side.  Respiratory: No respiratory distress. She has no wheezes. She has no rhonchi. She has no rales.  GI: Soft. Bowel sounds are normal. There is no tenderness.  Musculoskeletal:       Right ankle: She exhibits swelling.       Left ankle: She exhibits swelling.  Lymphadenopathy:    She has no cervical adenopathy.  Neurological: She is alert. No cranial nerve deficit.  Patient unable to straighten her legs out all the way. She is able to straight leg raise bilaterally.   Skin: Skin is warm. Bruising noted. Nails show no clubbing.  Psychiatric: She has a normal mood and affect.    Data Reviewed: Basic Metabolic Panel:  Recent Labs Lab 02/18/15 2245 02/20/15 0305  NA 135  --   K 4.7  --   CL 103  --   CO2 25  --   GLUCOSE 71  --   BUN 29*  --   CREATININE 1.50* 1.41*  CALCIUM 8.7*  --    Liver Function Tests:  Recent Labs Lab 02/18/15 2245  AST 33  ALT 16  ALKPHOS 139*  BILITOT 0.7  PROT 7.7  ALBUMIN 3.5   CBC:  Recent Labs Lab 02/18/15 2245  WBC 11.8*  NEUTROABS 7.1*  HGB 13.1  HCT 39.9  MCV 90.1  PLT 232    CBG:  Recent Labs Lab 02/21/15 1117 02/21/15 1524 02/21/15 2132 02/22/15 0713 02/22/15 1123  GLUCAP 101* 116* 152* 111* 178*      Studies: Dg Chest Port 1 View  02/22/2015  CLINICAL DATA:  Nonproductive cough for 1 day EXAM: PORTABLE CHEST 1 VIEW COMPARISON:  June 16, 2013 FINDINGS: There is no edema or consolidation. Heart is upper normal in size with pulmonary vascularity within normal limits. No adenopathy. There is atherosclerotic calcification in the aorta. There is degenerative change in the right shoulder. IMPRESSION: No edema or consolidation. Electronically Signed   By: Bretta BangWilliam  Woodruff III M.D.   On: 02/22/2015 13:24    Scheduled Meds: .  atorvastatin  20 mg Oral Daily  . [START ON 02/23/2015] ceFAZolin  1 g Other To OR  . docusate sodium  100 mg Oral BID  . donepezil  10 mg Oral Daily  . fluticasone  2 spray Each Nare Daily  . insulin aspart  0-9 Units Subcutaneous TID WC  . levothyroxine  75 mcg Oral Daily  . linagliptin  5 mg Oral Daily  . pioglitazone  45 mg Oral Daily    Assessment/Plan:  1. Acute T11 compression fracture. Kyphoplasty by Dr. Rosita Kea on Tuesday. Pain control with oral medications. Physical therapy evaluation appreciated. Potential discharge Tuesday evening. No contraindications to surgery at this time. EKG ordered. 2. Spinal stenosis is severe at multiple levels.  Patient has not walked in years. This is likely the cause of her not walking. 3. Type 2 diabetes without complication- hold all oral medications tomorrow for surgery. Sliding scale coverage only. 4. Hyperlipidemia unspecified continue atorvastatin 5. Hypothyroidism unspecified continue levothyroxine 6. Chronic kidney disease stage III 7. Chronic cough- chest x-ray negative.  Code Status:     Code Status Orders        Start     Ordered   02/19/15 0742  Full code   Continuous     02/19/15 0741     Family Communication: Daughter at the bedside Disposition Plan: Home with home health. Potentially Tuesday evening.  Consultants:  Orthopedic surgery  Physical therapy  Time spent: 20 minutes  Alford Highland  [facility]

## 2015-02-22 NOTE — Clinical Social Work Note (Signed)
Clinical Social Work Assessment  Patient Details  Name: Tasha Grant MRN: [id number] Date of Birth: Dec 10, 1930  Date of referral:  02/22/15               Reason for consult:  Facility Placement, Other (Comment Required) (Patient is Medicare Observation )                Permission sought to share information with:  Case Manager Permission granted to share information::  Yes, Verbal Permission Granted  Name::        Agency::     Relationship::     Contact Information:     Housing/Transportation Living arrangements for the past 2 months:  Single Family Home Source of Information:  Patient, Adult Children Patient Interpreter Needed:  None Criminal Activity/Legal Involvement Pertinent to Current Situation/Hospitalization:  No - Comment as needed Significant Relationships:  Adult Children Lives with:  Adult Children Do you feel safe going back to the place where you live?  Yes Need for family participation in patient care:  Yes (Comment)  Care giving concerns:  Patient lives in [place] with her daughter Kerry Hough and caregivers.   Social Worker assessment / plan: Holiday representative (CSW) received SNF consult. PT is recommending 24 hour supervision. CSW met with patient and her son Chrissie Noa was at bedside. Patient was laying in bed and was alert and oriented. Patient reported that she lives in [place] with her daughter Kerry Hough. Per son he lives in [place] and patient has caregivers through [facility]. Patient reported that she has no HPOA. CSW explained that patient is under Medicare Observation status, which means her Medicare will not pay for rehab. Patient and son verbalized their understanding. Son requested home health services. RN Case Manager, RN and MD are aware of above. Please reconsult if future social work needs arise. CSW signing off.   Employment status:  Disabled (Comment on whether or not currently receiving Disability), Retired Forensic scientist:  Medicare (Medicare  Observation ) PT Recommendations:  24 [place] / Referral to community resources:  Other (Comment Required) (Home Health )  Patient/Family's Response to care: Patient understands that Medicare will not pay for rehab under observation and is agreeable to going home.   Patient/Family's Understanding of and Emotional Response to Diagnosis, Current Treatment, and Prognosis: Patient and son were pleasant throughout assessment.   Emotional Assessment Appearance:  Appears stated age Attitude/Demeanor/Rapport:    Affect (typically observed):  Accepting, Adaptable, Pleasant Orientation:  Oriented to Self, Oriented to Place, Oriented to  Time, Oriented to Situation Alcohol / Substance use:  Not Applicable Psych involvement (Current and /or in the community):  No (Comment)  Discharge Needs  Concerns to be addressed:  Discharge Planning Concerns Readmission within the last 30 days:  No Current discharge risk:  None Barriers to Discharge:  No Barriers Identified   Loralyn Freshwater, LCSW 02/22/2015, 12:52 PM

## 2015-02-23 ENCOUNTER — Observation Stay: Payer: Medicare Other | Admitting: Anesthesiology

## 2015-02-23 ENCOUNTER — Encounter
Admission: EM | Disposition: A | Discharge status: Home / Self Care | Payer: Self-pay | Source: Home / Self Care | Attending: Emergency Medicine

## 2015-02-23 ENCOUNTER — Encounter: Payer: Self-pay | Admitting: Anesthesiology

## 2015-02-23 ENCOUNTER — Observation Stay: Payer: Medicare Other

## 2015-02-23 DIAGNOSIS — M4854XA Collapsed vertebra, not elsewhere classified, thoracic region, initial encounter for fracture: Secondary | ICD-10-CM | POA: Diagnosis not present

## 2015-02-23 HISTORY — PX: KYPHOPLASTY: SHX5884

## 2015-02-23 LAB — CBC WITH DIFFERENTIAL/PLATELET
BASOS PCT: 1 %
Basophils Absolute: 0 10*3/uL (ref 0–0.1)
EOS ABS: 0.1 10*3/uL (ref 0–0.7)
Eosinophils Relative: 1 %
HCT: 38.5 % (ref 35.0–47.0)
Hemoglobin: 12.9 g/dL (ref 12.0–16.0)
LYMPHS ABS: 1.8 10*3/uL (ref 1.0–3.6)
Lymphocytes Relative: 24 %
MCH: 29.8 pg (ref 26.0–34.0)
MCHC: 33.4 g/dL (ref 32.0–36.0)
MCV: 89.1 fL (ref 80.0–100.0)
MONO ABS: 0.9 10*3/uL (ref 0.2–0.9)
MONOS PCT: 11 %
Neutro Abs: 4.8 10*3/uL (ref 1.4–6.5)
Neutrophils Relative %: 63 %
Platelets: 249 10*3/uL (ref 150–440)
RBC: 4.32 MIL/uL (ref 3.80–5.20)
RDW: 14.3 % (ref 11.5–14.5)
WBC: 7.5 10*3/uL (ref 3.6–11.0)

## 2015-02-23 LAB — GLUCOSE, CAPILLARY
GLUCOSE-CAPILLARY: 109 mg/dL — AB (ref 65–99)
GLUCOSE-CAPILLARY: 158 mg/dL — AB (ref 65–99)
Glucose-Capillary: 147 mg/dL — ABNORMAL HIGH (ref 65–99)

## 2015-02-23 LAB — CREATININE, SERUM
CREATININE: 1.45 mg/dL — AB (ref 0.44–1.00)
GFR calc Af Amer: 38 mL/min — ABNORMAL LOW (ref >60)
GFR, EST NON AFRICAN AMERICAN: 33 mL/min — AB (ref >60)

## 2015-02-23 SURGERY — KYPHOPLASTY
Anesthesia: General | Site: Thoracic | Wound class: Clean

## 2015-02-23 MED ORDER — NICOTINE 21 MG/24HR TD PT24: 21.0000 mg | MEDICATED_PATCH | Freq: Every day | TRANSDERMAL | Status: AC

## 2015-02-23 MED ORDER — CALCIUM CARBONATE-VITAMIN D 500-200 MG-UNIT PO TABS
1.0000 | ORAL_TABLET | Freq: Three times a day (TID) | ORAL | Status: DC
Start: 2015-02-23 — End: 2015-02-23

## 2015-02-23 MED ORDER — HYDROCODONE-ACETAMINOPHEN 5-325 MG PO TABS: 1.0000 | ORAL_TABLET | Freq: Three times a day (TID) | ORAL | Status: DC | PRN

## 2015-02-23 MED ORDER — CALCIUM CARBONATE-VITAMIN D 500-200 MG-UNIT PO TABS: 1.0000 | ORAL_TABLET | Freq: Three times a day (TID) | ORAL | Status: DC

## 2015-02-23 MED ORDER — KETAMINE HCL 50 MG/ML IJ SOLN
INTRAMUSCULAR | Status: DC | PRN
  Administered 2015-02-23: 4 mg via INTRAMUSCULAR
  Administered 2015-02-23: 15 mg via INTRAMUSCULAR

## 2015-02-23 MED ORDER — HYDROMORPHONE HCL 1 MG/ML IJ SOLN
INTRAMUSCULAR | Status: DC | PRN
  Administered 2015-02-23 (×2): 0.5 mg via INTRAVENOUS

## 2015-02-23 MED ORDER — BUPIVACAINE-EPINEPHRINE (PF) 0.5% -1:200000 IJ SOLN
INTRAMUSCULAR | Status: DC | PRN
  Administered 2015-02-23: 10 mL via PERINEURAL

## 2015-02-23 MED ORDER — IOHEXOL 180 MG/ML  SOLN
INTRAMUSCULAR | Status: AC
  Filled 2015-02-23: qty 40

## 2015-02-23 MED ORDER — IOHEXOL 180 MG/ML  SOLN
INTRAMUSCULAR | Status: DC | PRN
Start: 2015-02-23 — End: 2015-02-23
  Administered 2015-02-23: 40 mL via INTRAVENOUS

## 2015-02-23 MED ORDER — LIDOCAINE HCL (PF) 1 % IJ SOLN
INTRAMUSCULAR | Status: AC
  Filled 2015-02-23: qty 60

## 2015-02-23 MED ORDER — PROPOFOL 500 MG/50ML IV EMUL
INTRAVENOUS | Status: DC | PRN
  Administered 2015-02-23: 50 ug/kg/min via INTRAVENOUS

## 2015-02-23 MED ORDER — LACTATED RINGERS IV SOLN
INTRAVENOUS | Status: DC | PRN
  Administered 2015-02-23: 10:00:00 via INTRAVENOUS

## 2015-02-23 MED ORDER — BUPIVACAINE-EPINEPHRINE (PF) 0.5% -1:200000 IJ SOLN
INTRAMUSCULAR | Status: AC
  Filled 2015-02-23: qty 30

## 2015-02-23 MED ORDER — FENTANYL CITRATE (PF) 100 MCG/2ML IJ SOLN: 25.0000 ug | INTRAMUSCULAR | Status: DC | PRN

## 2015-02-23 MED ORDER — PROPOFOL 10 MG/ML IV BOLUS
INTRAVENOUS | Status: DC | PRN
  Administered 2015-02-23: 40 mg via INTRAVENOUS

## 2015-02-23 MED ORDER — INSULIN ASPART 100 UNIT/ML ~~LOC~~ SOLN: SUBCUTANEOUS | Status: DC

## 2015-02-23 MED ORDER — SODIUM CHLORIDE 0.9 % IV SOLN
250.0000 mg | INTRAVENOUS | Status: DC | PRN
  Administered 2015-02-23: 5 ug/kg/min via INTRAVENOUS

## 2015-02-23 MED ORDER — ALENDRONATE SODIUM 10 MG PO TABS: 10.0000 mg | ORAL_TABLET | ORAL | Status: DC

## 2015-02-23 MED ORDER — LIDOCAINE HCL 1 % IJ SOLN
INTRAMUSCULAR | Status: DC | PRN
Start: 2015-02-23 — End: 2015-02-23
  Administered 2015-02-23: 20 mL

## 2015-02-23 SURGICAL SUPPLY — 14 items
CEMENT BONE KYPHON CDS (Cement) ×3 IMPLANT
DEVICE BIOPSY BONE KYPHX (INSTRUMENTS) ×3 IMPLANT
DRAPE C-ARM XRAY 36X54 (DRAPES) ×3 IMPLANT
DURAPREP 26ML APPLICATOR (WOUND CARE) ×3 IMPLANT
GLOVE SURG ORTHO 9.0 STRL STRW (GLOVE) ×3 IMPLANT
GOWN SPECIALTY ULTRA XL (MISCELLANEOUS) ×3 IMPLANT
GOWN STRL REUS W/ TWL LRG LVL3 (GOWN DISPOSABLE) ×1 IMPLANT
GOWN STRL REUS W/TWL LRG LVL3 (GOWN DISPOSABLE) ×2
LIQUID BAND (GAUZE/BANDAGES/DRESSINGS) ×3 IMPLANT
PACK KYPHOPLASTY (MISCELLANEOUS) ×3 IMPLANT
STRAP SAFETY BODY (MISCELLANEOUS) ×3 IMPLANT
TRAY KYPHOPAK 15/2 EXPRESS (KITS) ×3 IMPLANT
TRAY KYPHOPAK 15/3 EXPRESS 1ST (MISCELLANEOUS) IMPLANT
TRAY KYPHOPAK 20/3 EXPRESS 1ST (MISCELLANEOUS) IMPLANT

## 2015-02-23 NOTE — Transfer of Care (Signed)
Immediate Anesthesia Transfer of Care Note  Patient: Tasha SprinklesMamie L Bergfeld  Procedure(s) Performed: Procedure(s): KYPHOPLASTY T11 (N/A)  Patient Location: PACU  Anesthesia Type:General  Level of Consciousness: awake, alert , oriented and patient cooperative  Airway & Oxygen Therapy: Patient Spontanous Breathing and Patient connected to nasal cannula oxygen  Post-op Assessment: Report given to RN and Post -op Vital signs reviewed and stable  Post vital signs: Reviewed and stable  Last Vitals:  Filed Vitals:   02/23/15 1123  BP: 111/69  Pulse:   Temp: 36.5 C  Resp: 16    Complications: No apparent anesthesia complications

## 2015-02-23 NOTE — Anesthesia Preprocedure Evaluation (Signed)
Anesthesia Evaluation  Patient identified by MRN, date of birth, ID band Patient awake    Reviewed: Allergy & Precautions, H&P , NPO status , Patient's Chart, lab work & pertinent test results, reviewed documented beta blocker date and time   History of Anesthesia Complications Negative for: history of anesthetic complications  Airway Mallampati: I  TM Distance: >3 FB Neck ROM: full    Dental no notable dental hx. (+) Edentulous Upper, Upper Dentures, Edentulous Lower   Pulmonary neg shortness of breath, neg sleep apnea, COPD,  COPD inhaler, neg recent URI, Current Smoker,    Pulmonary exam normal breath sounds clear to auscultation       Cardiovascular Exercise Tolerance: Good negative cardio ROS Normal cardiovascular exam Rhythm:regular Rate:Normal     Neuro/Psych negative neurological ROS  negative psych ROS   GI/Hepatic Neg liver ROS, GERD  ,  Endo/Other  diabetes, Insulin DependentHypothyroidism   Renal/GU CRFRenal disease  negative genitourinary   Musculoskeletal   Abdominal   Peds  Hematology negative hematology ROS (+)   Anesthesia Other Findings Past Medical History:   Renal disorder                                               Diabetes mellitus without complication (HCC)                 Reproductive/Obstetrics negative OB ROS                             Anesthesia Physical Anesthesia Plan  ASA: III  Anesthesia Plan: General   Post-op Pain Management:    Induction:   Airway Management Planned:   Additional Equipment:   Intra-op Plan:   Post-operative Plan:   Informed Consent: I have reviewed the patients History and Physical, chart, labs and discussed the procedure including the risks, benefits and alternatives for the proposed anesthesia with the patient or authorized representative who has indicated his/her understanding and acceptance.   Dental Advisory  Given  Plan Discussed with: Anesthesiologist, CRNA and Surgeon  Anesthesia Plan Comments:         Anesthesia Quick Evaluation

## 2015-02-23 NOTE — Progress Notes (Signed)
Discharge instructions and prescriptions given to pt , verbalizes understanding , discharged via wheelchair care of family

## 2015-02-23 NOTE — Progress Notes (Signed)
Strong push/pull reflexes to lower extremities. Denies pain.

## 2015-02-23 NOTE — Op Note (Signed)
02/18/2015 - 02/23/2015  11:19 AM  PATIENT:  Tasha SprinklesMamie L Sivak  81 y.o. female  PRE-OPERATIVE DIAGNOSIS:  t11 compression  POST-OPERATIVE DIAGNOSIS:  t11 compression  PROCEDURE:  Procedure(s): KYPHOPLASTY T11 (N/A)  SURGEON: Leitha SchullerMichael J Kellyjo Edgren, MD  ASSISTANTS: None  ANESTHESIA:   local and MAC  EBL:  Total I/O In: 500 [I.V.:500] Out: -   BLOOD ADMINISTERED:none  DRAINS: none   LOCAL MEDICATIONS USED:  MARCAINE    and XYLOCAINE   SPECIMEN:  Source of Specimen:  T11 vertebral body biopsy  DISPOSITION OF SPECIMEN:  PATHOLOGY  COUNTS:  YES  TOURNIQUET:  * No tourniquets in log *  IMPLANTS: Bone cement  DICTATION: .Dragon Dictation patient brought the operating room and after adequate sedation was given, patient was placed prone. C-arm was brought in and good visualization of T11 could be obtained in both AP and lateral projections. After appropriate patient identification timeout procedure skin was prepped with alcohol and 5 cc of 1% Xylocaine was a low traded on both sides at T11. The back was then prepped and draped in usual sterile manner fashion and repeat timeout procedure carried out. Spinal needle was then used on the right side getting down to the pedicle and a 50-50 mix of 1% Xylocaine have percent Sensorcaine with epinephrine infiltrated down to the bone and it and along the tract. After allowing the local to set a small incision was made and it trochars advanced in the vertebral body care being taken to stay lateral to the medial wall as it went through the pedicle. Crossed the midline and so a second incision and trocar was not required. Biopsy was obtained following this a balloon was inserted and raised to 3 cc followed by infiltration of 3 cc of bone cement. The bone cemented set and with minimal extravasation the trochars removed and permanent C-arm views obtained. A Band-Aid was applied after closing the wound with Dermabond and patient center comes stable condition  PLAN  OF CARE: Continues as inpatient  PATIENT DISPOSITION:  PACU - hemodynamically stable.

## 2015-02-23 NOTE — Discharge Summary (Signed)
[facility] - [place] at [facility]   PATIENT NAME: Tasha Grant    MR#:  [id number]  DATE OF BIRTH:  [birth year]  DATE OF ADMISSION:  02/18/2015 ADMITTING PHYSICIAN: Bobette Mo, MD  DATE OF DISCHARGE: 02/23/2015  PRIMARY CARE PHYSICIAN: Hyman Hopes, MD    ADMISSION DIAGNOSIS:  Intractable pain [R52] L2 vertebral fracture, closed, initial encounter [S32.029A]  DISCHARGE DIAGNOSIS:  Principal Problem:   Intractable back pain Active Problems:   Type 2 diabetes mellitus (HCC)   Hyperlipidemia   Hypothyroidism   Compression fracture of L2 lumbar vertebra with delayed healing   Constipation   Tobacco abuse disorder   Chronic renal insufficiency, stage III (moderate)   SECONDARY DIAGNOSIS:   Past Medical History  Diagnosis Date  . Renal disorder   . Diabetes mellitus without complication (HCC)     HOSPITAL COURSE:   1. T11 compression fracture. Patient was in a a lot of pain requiring pain medications. Patient was seen in consultation by orthopedic surgery and they recommended Dr. Rosita Kea see in consultation. Kyphoplasty was done on 02/23/2015. Patient felt well postprocedure and was discharged home. For osteoporosis Fosamax and calcium and vitamin D was prescribed. Pain medication prescribed but I don't think she'll needed. 2. Type 2 diabetes mellitus- her Lantus was stopped. I will go with sliding scale and her oral medications. All sugars have been on the lower side. 3. Chronic kidney disease stage III 4. Hypothyroidism unspecified- on levothyroxine 5. Hyperlipidemia unspecified- continue statin 6. Dementia- continue Aricept 7. 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 Patient has leg contractions and can't straighten out her knees all the way. She is wheelchair bound.  DISCHARGE CONDITIONS:   Satisfactory  CONSULTS OBTAINED:  Treatment Team:  Christena Flake, MD  DRUG ALLERGIES:   Allergies  Allergen Reactions   . Codeine Itching  . Morphine And Related Itching  . Quinapril Other (See Comments)  . Sulfa Antibiotics Nausea Only    DISCHARGE MEDICATIONS:   Current Discharge Medication List    START taking these medications   Details  calcium-vitamin D (OSCAL WITH D) 500-200 MG-UNIT tablet Take 1 tablet by mouth 3 (three) times daily. Qty: 90 tablet, Refills: 0    HYDROcodone-acetaminophen (NORCO/VICODIN) 5-325 MG tablet Take 1 tablet by mouth every 8 (eight) hours as needed for severe pain. Qty: 30 tablet, Refills: 0    insulin aspart (NOVOLOG) 100 UNIT/ML injection Check finger stick before meals.  For glucose of 121 to 150 give 1 unit.  151 to 200 give 2 units. 201-250 give 3 units.  251-300 give 5 units.  301-350 give 7 units; 351-400 give 9 units Qty: 10 mL, Refills: 0    nicotine (NICODERM CQ - DOSED IN MG/24 HOURS) 21 mg/24hr [place] [facility] 1 Grant (21 mg total) onto the skin daily. Qty: 28 Grant, Refills: 0      CONTINUE these medications which have CHANGED   Details  alendronate (FOSAMAX) 10 MG tablet Take 1 tablet (10 mg total) by mouth once a week. Qty: 4 tablet, Refills: 0      CONTINUE these medications which have NOT CHANGED   Details  ASMANEX 60 METERED DOSES 220 MCG/INH inhaler Inhale 2 puffs into the lungs daily.    atorvastatin (LIPITOR) 20 MG tablet Take 1 tablet by mouth daily.    donepezil (ARICEPT) 10 MG tablet Take 1 tablet by mouth at bedtime.     fluticasone (FLONASE) 50 MCG/ACT nasal spray Place  2 sprays into both nostrils daily.    JANUVIA 50 MG tablet Take 1 tablet by mouth daily.    levothyroxine (SYNTHROID, LEVOTHROID) 75 MCG tablet Take 1 tablet by mouth daily.    pioglitazone (ACTOS) 45 MG tablet Take 1 tablet by mouth daily.    polyethylene glycol powder (GLYCOLAX/MIRALAX) powder Take 17 g by mouth daily as needed.    VENTOLIN HFA 108 (90 BASE) MCG/ACT inhaler Inhale 2 puffs into the lungs every 4 (four) hours as needed.      STOP taking these  medications     LANTUS SOLOSTAR 100 UNIT/ML Solostar Pen          DISCHARGE INSTRUCTIONS:   Follow-up with Dr. Rosita Kea 2 weeks Follow-up PMD one week  If you experience worsening of your admission symptoms, develop shortness of breath, life threatening emergency, suicidal or homicidal thoughts you must seek medical attention immediately by calling 911 or calling your MD immediately  if symptoms less severe.  You Must read complete instructions/literature along with all the possible adverse reactions/side effects for all the Medicines you take and that have been prescribed to you. Take any new Medicines after you have completely understood and accept all the possible adverse reactions/side effects.   Please note  You were cared for by a hospitalist during your hospital stay. If you have any questions about your discharge medications or the care you received while you were in the hospital after you are discharged, you can call the unit and asked to speak with the hospitalist on call if the hospitalist that took care of you is not available. Once you are discharged, your primary care physician will handle any further medical issues. Please note that NO REFILLS for any discharge medications will be authorized once you are discharged, as it is imperative that you return to your primary care physician (or establish a relationship with a primary care physician if you do not have one) for your aftercare needs so that they can reassess your need for medications and monitor your lab values.    Today   CHIEF COMPLAINT:   Chief Complaint  Patient presents with  . Back Pain    HISTORY OF PRESENT ILLNESS:  Tasha Grant  is a 81 y.o. female presented with back pain. On MRI had a subacute T11 fracture.   VITAL SIGNS:  Blood pressure 141/60, pulse 87, temperature 98 F (36.7 C), temperature source Oral, resp. rate 18, height  (1.676 m), weight 63.912 kg (140 lb 14.4 oz), SpO2 100  %.    PHYSICAL EXAMINATION:  GENERAL:  81 y.o.-year-old patient lying in the bed with no acute distress.  EYES: Pupils equal, round, reactive to light and accommodation. No scleral icterus. Extraocular muscles intact.  HEENT: Head atraumatic, normocephalic. Oropharynx and nasopharynx clear.  NECK:  Supple, no jugular venous distention. No thyroid enlargement, no tenderness.  LUNGS: Normal breath sounds bilaterally, no wheezing, rales,rhonchi or crepitation. No use of accessory muscles of respiration.  CARDIOVASCULAR: S1, S2 normal. No murmurs, rubs, or gallops.  ABDOMEN: Soft, non-tender, non-distended. Bowel sounds present. No organomegaly or mass.  EXTREMITIES: No pedal edema, cyanosis, or clubbing.  NEUROLOGIC: Cranial nerves II through XII are intact. Sensation intact.   PSYCHIATRIC: The patient is alert and oriented x 3.  SKIN: No obvious rash, lesion, or ulcer. Bruising seen on arms and legs  DATA REVIEW:   CBC  Recent Labs Lab 02/23/15 0511  WBC 7.5  HGB 12.9  HCT 38.5  PLT  249    Chemistries   Recent Labs Lab 02/18/15 2245  02/23/15 0511  NA 135  --   --   K 4.7  --   --   CL 103  --   --   CO2 25  --   --   GLUCOSE 71  --   --   BUN 29*  --   --   CREATININE 1.50*  < > 1.45*  CALCIUM 8.7*  --   --   AST 33  --   --   ALT 16  --   --   ALKPHOS 139*  --   --   BILITOT 0.7  --   --   < > = values in this interval not displayed.   Microbiology Results  Results for orders placed or performed during the hospital encounter of 02/18/15  Surgical pcr screen     Status: None   Collection Time: 02/22/15  2:27 PM  Result Value Ref Range Status   MRSA, PCR NEGATIVE NEGATIVE Final   Staphylococcus aureus NEGATIVE NEGATIVE Final    Comment:        The Xpert SA Assay (FDA approved for NASAL specimens in patients over 79 years of age), is one component of a comprehensive surveillance program.  Test performance has been validated by [facility] for patients  greater than or equal to 79 year old. It is not intended to diagnose infection nor to guide or monitor treatment.     RADIOLOGY:  Dg Thoracic Spine 2 View  02/23/2015  CLINICAL DATA:  Vertebral augmentation EXAM: THORACIC SPINE 2 VIEWS ; DG C-ARM 61-120 MIN COMPARISON:  MRI 02/19/2015 FINDINGS: 2 intraoperative spot images demonstrate changes of vertebral augmentation in the lower thoracic spine, reportedly T11. No visible complicating feature. IMPRESSION: T11 vertebral augmentation without visible complicating feature. Electronically Signed   By: Charlett NoseKevin  Dover M.D.   On: 02/23/2015 11:30   Dg Chest Port 1 View  02/22/2015  CLINICAL DATA:  Nonproductive cough for 1 day EXAM: PORTABLE CHEST 1 VIEW COMPARISON:  June 16, 2013 FINDINGS: There is no edema or consolidation. Heart is upper normal in size with pulmonary vascularity within normal limits. No adenopathy. There is atherosclerotic calcification in the aorta. There is degenerative change in the right shoulder. IMPRESSION: No edema or consolidation. Electronically Signed   By: Bretta BangWilliam  Woodruff III M.D.   On: 02/22/2015 13:24   Dg C-arm 61-120 Min  02/23/2015  CLINICAL DATA:  Vertebral augmentation EXAM: THORACIC SPINE 2 VIEWS ; DG C-ARM 61-120 MIN COMPARISON:  MRI 02/19/2015 FINDINGS: 2 intraoperative spot images demonstrate changes of vertebral augmentation in the lower thoracic spine, reportedly T11. No visible complicating feature. IMPRESSION: T11 vertebral augmentation without visible complicating feature. Electronically Signed   By: Charlett NoseKevin  Dover M.D.   On: 02/23/2015 11:30    Management plans discussed with the patient, family and they are in agreement.  CODE STATUS:     Code Status Orders        Start     Ordered   02/19/15 0742  Full code   Continuous     02/19/15 0741      TOTAL TIME TAKING CARE OF THIS PATIENT: 35 minutes.    Alford HighlandWIETING, Brenisha Tsui M.D on 02/23/2015 at 2:10 PM  Between 7am to 6pm - Pager -  [phone]  After 6pm go to [email or web address] - password [email or web address] [facility]  [facility]  Office  [phone]  CC: Primary care physician; Lawerance BachBurns, Harriett P,  MD     

## 2015-02-23 NOTE — Progress Notes (Signed)
PT Cancellation Note  Patient Details Name: Tasha SprinklesMamie L Grant MRN: [id number] DOB: Feb 12, 1931   Cancelled Treatment:    Reason Eval/Treat Not Completed: Patient at procedure or test/unavailable. Patient currently off the floor for procedure. PT will re-attempt as patient is available and appropriate.   Kerin RansomPatrick A Danzel Marszalek, PT, DPT    02/23/2015, 10:23 AM

## 2015-02-24 LAB — SURGICAL PATHOLOGY

## 2015-02-28 NOTE — Anesthesia Postprocedure Evaluation (Signed)
  Anesthesia Post-op Note  Patient: Tasha SprinklesMamie L Grant  Procedure(s) Performed: Procedure(s): KYPHOPLASTY T11 (N/A)  Anesthesia type:General  Patient location: PACU  Post pain: Pain level controlled  Post assessment: Post-op Vital signs reviewed, Patient's Cardiovascular Status Stable, Respiratory Function Stable, Patent Airway and No signs of Nausea or vomiting  Post vital signs: Reviewed and stable  Last Vitals:  Filed Vitals:   02/23/15 1247  BP: 141/60  Pulse: 87  Temp: 36.7 C  Resp: 18    Level of consciousness: awake, alert  and patient cooperative  Complications: No apparent anesthesia complications

## 2017-02-04 ENCOUNTER — Emergency Department: Payer: Medicare Other

## 2017-02-04 ENCOUNTER — Encounter: Payer: Self-pay | Admitting: Emergency Medicine

## 2017-02-04 ENCOUNTER — Emergency Department
Admission: EM | Admit: 2017-02-04 | Discharge: 2017-02-04 | Disposition: A | Discharge status: Home / Self Care | Payer: Medicare Other | Attending: Emergency Medicine | Admitting: Emergency Medicine

## 2017-02-04 DIAGNOSIS — M545 Low back pain: Secondary | ICD-10-CM | POA: Diagnosis present

## 2017-02-04 DIAGNOSIS — E039 Hypothyroidism, unspecified: Secondary | ICD-10-CM | POA: Insufficient documentation

## 2017-02-04 DIAGNOSIS — Z794 Long term (current) use of insulin: Secondary | ICD-10-CM | POA: Diagnosis not present

## 2017-02-04 DIAGNOSIS — F17210 Nicotine dependence, cigarettes, uncomplicated: Secondary | ICD-10-CM | POA: Insufficient documentation

## 2017-02-04 DIAGNOSIS — S22080A Wedge compression fracture of T11-T12 vertebra, initial encounter for closed fracture: Secondary | ICD-10-CM

## 2017-02-04 DIAGNOSIS — E1122 Type 2 diabetes mellitus with diabetic chronic kidney disease: Secondary | ICD-10-CM | POA: Insufficient documentation

## 2017-02-04 DIAGNOSIS — Z96642 Presence of left artificial hip joint: Secondary | ICD-10-CM | POA: Insufficient documentation

## 2017-02-04 DIAGNOSIS — N183 Chronic kidney disease, stage 3 (moderate): Secondary | ICD-10-CM | POA: Diagnosis not present

## 2017-02-04 DIAGNOSIS — M4854XA Collapsed vertebra, not elsewhere classified, thoracic region, initial encounter for fracture: Secondary | ICD-10-CM | POA: Insufficient documentation

## 2017-02-04 DIAGNOSIS — S32040A Wedge compression fracture of fourth lumbar vertebra, initial encounter for closed fracture: Secondary | ICD-10-CM

## 2017-02-04 DIAGNOSIS — M4856XA Collapsed vertebra, not elsewhere classified, lumbar region, initial encounter for fracture: Secondary | ICD-10-CM | POA: Diagnosis not present

## 2017-02-04 DIAGNOSIS — S32020A Wedge compression fracture of second lumbar vertebra, initial encounter for closed fracture: Secondary | ICD-10-CM

## 2017-02-04 DIAGNOSIS — M5416 Radiculopathy, lumbar region: Secondary | ICD-10-CM | POA: Diagnosis not present

## 2017-02-04 HISTORY — DX: Other chronic pain: G89.29

## 2017-02-04 HISTORY — DX: Low back pain: M54.5

## 2017-02-04 HISTORY — DX: Low back pain, unspecified: M54.50

## 2017-02-04 MED ORDER — PREDNISONE 50 MG PO TABS: 50.0000 mg | ORAL_TABLET | Freq: Every day | ORAL | 0 refills | Status: DC

## 2017-02-04 MED ORDER — HYDROCODONE-ACETAMINOPHEN 5-325 MG PO TABS
1.0000 | ORAL_TABLET | Freq: Once | ORAL | Status: AC
  Administered 2017-02-04: 1 via ORAL
  Filled 2017-02-04: qty 1

## 2017-02-04 MED ORDER — DEXAMETHASONE SODIUM PHOSPHATE 10 MG/ML IJ SOLN
10.0000 mg | Freq: Once | INTRAMUSCULAR | Status: AC
  Administered 2017-02-04: 10 mg via INTRAMUSCULAR
  Filled 2017-02-04: qty 1

## 2017-02-04 MED ORDER — HYDROCODONE-ACETAMINOPHEN 5-325 MG PO TABS: 1.0000 | ORAL_TABLET | ORAL | 0 refills | Status: AC | PRN

## 2017-02-04 NOTE — ED Triage Notes (Signed)
Per pt she awoke with left hip pain this morning and had trouble getting out of bed. Pain decreased from 10 to 5 with heat. Hx of chronic pain and hip replacement

## 2017-02-04 NOTE — ED Provider Notes (Signed)
[facility] Emergency Department Provider Note  ____________________________________________  Time seen: Approximately 3:32 PM  I have reviewed the triage vital signs and the nursing notes.   HISTORY  Chief Complaint Hip Pain    HPI Tasha Grant is a 83 y.o. female Who presents to emergency department complaining of lower back and left hip pain. Patient reports that she has chronic low back and hip pain but over the past several days the pain has drastically worsen. Patient denies any falls or trauma. She denies anybowel or bladder dysfunction, saddle anesthesia, paresthesias. Patient does have a history of hip replacement as well as kyphoplasty. Patient reports that this morning was very difficult to "get out of bed and get moving." Patient has put heat on the area of both her lower back and her hip and has improved the pain from a 10 out of 10 to a 5 out of 10.  Patient has a medical history of type 2 diabetes, hypothyroidism, renal insufficiency. The patient denies any complaints with chronic medical problems.   Past Medical History:  Diagnosis Date  . Chronic low back pain without sciatica   . Diabetes mellitus without complication (HCC)   . Renal disorder     Patient Active Problem List   Diagnosis Date Noted  . Intractable back pain 02/19/2015  . Type 2 diabetes mellitus (HCC) 02/19/2015  . Hyperlipidemia 02/19/2015  . Hypothyroidism 02/19/2015  . Compression fracture of L2 lumbar vertebra with delayed healing 02/19/2015  . Constipation 02/19/2015  . Tobacco abuse disorder 02/19/2015  . Chronic renal insufficiency, stage III (moderate) (HCC) 02/19/2015    Past Surgical History:  Procedure Laterality Date  . JOINT REPLACEMENT    . KYPHOPLASTY N/A 02/23/2015   Procedure: KYPHOPLASTY T11;  Surgeon: Kennedy Bucker, MD;  Location: [facility] ORS;  Service: Orthopedics;  Laterality: N/A;  . TONSILLECTOMY      Prior to Admission medications   Medication  Sig Start Date End Date Taking? Authorizing Provider  alendronate (FOSAMAX) 10 MG tablet Take 1 tablet (10 mg total) by mouth once a week. 02/23/15   Alford Highland, MD  ASMANEX 60 METERED DOSES 220 MCG/INH inhaler Inhale 2 puffs into the lungs daily. 02/17/15   [provider]  atorvastatin (LIPITOR) 20 MG tablet Take 1 tablet by mouth daily. 02/17/15   [provider]  calcium-vitamin D (OSCAL WITH D) 500-200 MG-UNIT tablet Take 1 tablet by mouth 3 (three) times daily. 02/23/15   Alford Highland, MD  donepezil (ARICEPT) 10 MG tablet Take 1 tablet by mouth at bedtime.  02/17/15   [provider]  fluticasone (FLONASE) 50 MCG/ACT nasal spray Place 2 sprays into both nostrils daily. 01/24/15   [provider]  HYDROcodone-acetaminophen (NORCO/VICODIN) 5-325 MG tablet Take 1 tablet by mouth every 4 (four) hours as needed for moderate pain. 02/04/17   Brent Noto, Delorise Royals, PA-C  insulin aspart (NOVOLOG) 100 UNIT/ML injection Check finger stick before meals.  For glucose of 121 to 150 give 1 unit.  151 to 200 give 2 units. 201-250 give 3 units.  251-300 give 5 units.  301-350 give 7 units; 351-400 give 9 units 02/23/15   Wieting, Richard, MD  JANUVIA 50 MG tablet Take 1 tablet by mouth daily. 02/17/15   [provider]  levothyroxine (SYNTHROID, LEVOTHROID) 75 MCG tablet Take 1 tablet by mouth daily. 02/17/15   [provider]  nicotine (NICODERM CQ - DOSED IN MG/24 HOURS) 21 mg/24hr patch Place 1 patch (21 mg  total) onto the skin daily. 02/23/15   Alford Highland, MD  pioglitazone (ACTOS) 45 MG tablet Take 1 tablet by mouth daily. 02/17/15   [provider]  polyethylene glycol powder (GLYCOLAX/MIRALAX) powder Take 17 g by mouth daily as needed. 02/17/15   [provider]  predniSONE (DELTASONE) 50 MG tablet Take 1 tablet (50 mg total) by mouth daily with breakfast. 02/04/17   Emmaus Brandi, Delorise Royals, PA-C  VENTOLIN HFA 108 (90 BASE)  MCG/ACT inhaler Inhale 2 puffs into the lungs every 4 (four) hours as needed. 02/17/15   [provider]    Allergies Codeine; Morphine and related; Quinapril; and Sulfa antibiotics  History reviewed. No pertinent family history.  Social History Social History  Substance Use Topics  . Smoking status: Current Every Day Smoker    Packs/day: 0.50  . Smokeless tobacco: Never Used  . Alcohol use No     Review of Systems  Constitutional: No fever/chills Eyes: No visual changes.  Cardiovascular: no chest pain. Respiratory: no cough. No SOB. Gastrointestinal: No abdominal pain.  No nausea, no vomiting.  No diarrhea.  No constipation. Genitourinary: Negative for dysuria. No hematuria Musculoskeletal: positive for lower back and left hip pain Skin: Negative for rash, abrasions, lacerations, ecchymosis. Neurological: Negative for headaches, focal weakness or numbness. 10-point ROS otherwise negative.  ____________________________________________   PHYSICAL EXAM:  VITAL SIGNS: ED Triage Vitals  Enc Vitals Group     BP 02/04/17 1445 (!) 150/73     Pulse Rate 02/04/17 1445 62     Resp 02/04/17 1445 16     Temp 02/04/17 1445 (!) 97.5 F (36.4 C)     Temp src --      SpO2 02/04/17 1445 97 %     Weight 02/04/17 1446 155 lb (70.3 kg)     Height 02/04/17 1446  (1.651 m)     Head Circumference --      Peak Flow --      Pain Score 02/04/17 1444 5     Pain Loc --      Pain Edu? --      Excl. in GC? --      Constitutional: Alert and oriented. Well appearing and in no acute distress. Eyes: Conjunctivae are normal. PERRL. EOMI. Head: Atraumatic. Neck: No stridor.  No cervical spine tenderness to palpation.  Cardiovascular: Normal rate, regular rhythm. Normal S1 and S2.  Good peripheral circulation. Respiratory: Normal respiratory effort without tachypnea or retractions. Lungs CTAB. Good air entry to the bases with no decreased or absent breath  sounds. Gastrointestinal: Bowel sounds 4 quadrants. Soft and nontender to palpation. No guarding or rigidity. No palpable masses. No distention. No CVA tenderness. Musculoskeletal: Full range of motion to all extremities. No gross deformities appreciated.no gross deformities to spine upon inspection. Patient is tender to palpation midline over L3-S1. No specific point tenderness. No palpable abnormality or step-off. Patient is diffusely tender to palpation in bilateral paraspinal muscle groups. No palpable abnormality. Patient is nontender to palpation over bilateral sciatic notches. Negative straight leg raise bilaterally. Patient is mildly tender to palpation over the lateral hip but no specific point tenderness. No palpable abnormality. Patient has good range of motion with assistance. This is due to chronic weakness versus loss of range of motion or her son onset weakness. No tenderness to palpation over the iliac crest or pelvic rami. Dorsalis pedis pulse intact distally. Sensation intact and equal bilateral lower extremities. Neurologic:  Normal speech and language. No gross focal  neurologic deficits are appreciated.  Skin:  Skin is warm, dry and intact. No rash noted. Psychiatric: Mood and affect are normal. Speech and behavior are normal. Patient exhibits appropriate insight and judgement.   ____________________________________________   LABS (all labs ordered are listed, but only abnormal results are displayed)  Labs Reviewed - No data to display ____________________________________________  EKG   ____________________________________________  RADIOLOGY Festus Barren Cinda Hara, personally viewed and evaluated these images (plain radiographs) as part of my medical decision making, as well as reviewing the written report by the radiologist.  Dg Lumbar Spine Complete  Result Date: 02/04/2017 CLINICAL DATA:  Worsening low back and hip pain, initial encounter EXAM: LUMBAR SPINE -  COMPLETE 4+ VIEW COMPARISON:  02/19/2015 FINDINGS: Changes of prior T11 kyphoplasty are again identified. Chronic compression deformity at L2 is seen and stable. Multilevel facet hypertrophic changes as well as osteophytic changes are seen. Chronic appearing L4 compression deformity is noted as well. No anterolisthesis is noted. Diffuse aortic calcifications are noted. IMPRESSION: Chronic compression deformities at T11, L2 and L4. Prior kyphoplasty at T11 is noted. Multilevel osteophytic changes are seen without acute abnormality. Electronically Signed   By: Alcide Clever M.D.   On: 02/04/2017 16:26   Dg Hip Unilat W Or Wo Pelvis 2-3 Views Left  Result Date: 02/04/2017 CLINICAL DATA:  Worsening of chronic hip and lower back pain. EXAM: DG HIP (WITH OR WITHOUT PELVIS) 2-3V LEFT COMPARISON:  CT of the head 08/08/2014 FINDINGS: Diffuse osteopenia. Stable appearance of intramedullary rod and screw fixation of the left proximal femur. Stable exuberant heterotopic ossification about the lesser trochanter. There is no evidence of hip fracture or dislocation. Mild osteoarthritic changes of the right hip. Moderate osteoarthritic changes of the lower lumbosacral spine. IMPRESSION: Stable appearance of the left hip post intramedullary rod and screw fixation. Mild osteoarthritic changes of the right hip. Osteopenia. Electronically Signed   By: Ted Mcalpine M.D.   On: 02/04/2017 16:29    ____________________________________________    PROCEDURES  Procedure(s) performed:    Procedures    Medications  dexamethasone (DECADRON) injection 10 mg (not administered)  HYDROcodone-acetaminophen (NORCO/VICODIN) 5-325 MG per tablet 1 tablet (not administered)     ____________________________________________   INITIAL IMPRESSION / ASSESSMENT AND PLAN / ED COURSE  Pertinent labs & imaging results that were available during my care of the patient were reviewed by me and considered in my medical decision  making (see chart for details).  Review of the [place] CSRS was performed in accordance of the [facility] prior to dispensing any controlled drugs.  Clinical Course as of Feb 05 1707  Wynelle Link Feb 04, 2017  1536 Patient presents emergency department or with worsening of lower back and left hip pain. No history of trauma. Patient was neurovascularly intact in bilateral lower extremity's. This time, differential included fractures of the spine or hip, lumbar radiculopathy, degenerative disc disease, osteoarthritis,hardware failure of left hip replacement, bursitis, pelvic fracture, UTI, idiopathic increase the pain.  [JC]    Clinical Course User Index [JC] Glorian Mcdonell, Delorise Royals, PA-C    Patient's diagnosis is consistent with multiple chronic compression fractures in the lumbar spine with lumbar radiculopathy. Patient was evaluated with x-rays of the hip and the lumbar spine. This time, patient has multiple levels of degenerative disc disease with multiple old compression fractures that are stable compared to prior imaging. At this time, no concerning findings of bowel or bladder dysfunction, saddle anesthesia or paresthesias warranting further evaluation with labs or further imaging.  I discussed at length with the patient diagnosis and prognosis. Patient is adamant that she wants to go home with some prednisone and pain medication and if things worsen she will return. Patient does have a caregiver and home. I discussed the patient with a caregiver and she understands diagnosis and prognosis. They will follow up with primary care in the morning.. Patient will be discharged home with prescriptions for short course of prednisone and limited prescription for Vicodin. Patient is to follow up with primary care as needed or otherwise directed. Patient is given ED precautions to return to the ED for any worsening or new symptoms.     ____________________________________________  FINAL CLINICAL IMPRESSION(S) / ED  DIAGNOSES  Final diagnoses:  Closed compression fracture of second lumbar vertebra, initial encounter (HCC)  Closed compression fracture of fourth lumbar vertebra, initial encounter (HCC)  T12 compression fracture (HCC)  Lumbar radiculopathy      NEW MEDICATIONS STARTED DURING THIS VISIT:  New Prescriptions   HYDROCODONE-ACETAMINOPHEN (NORCO/VICODIN) 5-325 MG TABLET    Take 1 tablet by mouth every 4 (four) hours as needed for moderate pain.   PREDNISONE (DELTASONE) 50 MG TABLET    Take 1 tablet (50 mg total) by mouth daily with breakfast.        This chart was dictated using voice recognition software/Dragon. Despite best efforts to proofread, errors can occur which can change the meaning. Any change was purely unintentional.    Racheal Patches, PA-C 02/04/17 1708    Emily Filbert, MD 02/04/17 [id number]

## 2017-02-04 NOTE — ED Notes (Signed)
No injury - previous joint replacement to that hip. Pt is alert and able to answer all questions. She states pain is in the back and runs into left hip joint.

## 2017-02-04 NOTE — ED Notes (Signed)
Patient presents to the ED with chronic left hip pain.  Patient denies any recent injury or fall.  Patient and family reports pain has been worse over the past 2-3 days.  Patient's family reports it took them 3 hours to assist patient to get dressed this morning.  Patient states heat improves pain.

## 2017-02-04 NOTE — ED Notes (Signed)
Provider in room to assess patient.  Will continue to monitor.   

## 2017-03-16 ENCOUNTER — Other Ambulatory Visit: Payer: Self-pay

## 2017-03-16 ENCOUNTER — Emergency Department: Payer: Medicare Other

## 2017-03-16 ENCOUNTER — Encounter: Payer: Self-pay | Admitting: Emergency Medicine

## 2017-03-16 ENCOUNTER — Emergency Department
Admission: EM | Admit: 2017-03-16 | Discharge: 2017-03-16 | Disposition: A | Discharge status: Home / Self Care | Payer: Medicare Other | Attending: Emergency Medicine | Admitting: Emergency Medicine

## 2017-03-16 DIAGNOSIS — J810 Acute pulmonary edema: Secondary | ICD-10-CM | POA: Insufficient documentation

## 2017-03-16 DIAGNOSIS — Z794 Long term (current) use of insulin: Secondary | ICD-10-CM | POA: Insufficient documentation

## 2017-03-16 DIAGNOSIS — R0602 Shortness of breath: Secondary | ICD-10-CM | POA: Insufficient documentation

## 2017-03-16 DIAGNOSIS — Z79899 Other long term (current) drug therapy: Secondary | ICD-10-CM | POA: Insufficient documentation

## 2017-03-16 DIAGNOSIS — J189 Pneumonia, unspecified organism: Secondary | ICD-10-CM

## 2017-03-16 DIAGNOSIS — I959 Hypotension, unspecified: Secondary | ICD-10-CM | POA: Diagnosis present

## 2017-03-16 DIAGNOSIS — F17200 Nicotine dependence, unspecified, uncomplicated: Secondary | ICD-10-CM | POA: Insufficient documentation

## 2017-03-16 DIAGNOSIS — E119 Type 2 diabetes mellitus without complications: Secondary | ICD-10-CM | POA: Insufficient documentation

## 2017-03-16 DIAGNOSIS — E039 Hypothyroidism, unspecified: Secondary | ICD-10-CM | POA: Insufficient documentation

## 2017-03-16 LAB — LACTIC ACID, PLASMA
LACTIC ACID, VENOUS: 1.8 mmol/L (ref 0.5–1.9)
LACTIC ACID, VENOUS: 1.7 mmol/L (ref 0.5–1.9)

## 2017-03-16 LAB — CBC WITH DIFFERENTIAL/PLATELET
BASOS PCT: 0 %
Basophils Absolute: 0 10*3/uL (ref 0–0.1)
EOS ABS: 0 10*3/uL (ref 0–0.7)
EOS PCT: 0 %
HCT: 34.6 % — ABNORMAL LOW (ref 35.0–47.0)
HEMOGLOBIN: 10.9 g/dL — AB (ref 12.0–16.0)
Lymphocytes Relative: 14 %
Lymphs Abs: 1.8 10*3/uL (ref 1.0–3.6)
MCH: 25.7 pg — AB (ref 26.0–34.0)
MCHC: 31.6 g/dL — AB (ref 32.0–36.0)
MCV: 81.4 fL (ref 80.0–100.0)
MONOS PCT: 6 %
Monocytes Absolute: 0.7 10*3/uL (ref 0.2–0.9)
NEUTROS PCT: 80 %
Neutro Abs: 9.8 10*3/uL — ABNORMAL HIGH (ref 1.4–6.5)
PLATELETS: 235 10*3/uL (ref 150–440)
RBC: 4.25 MIL/uL (ref 3.80–5.20)
RDW: 18.8 % — ABNORMAL HIGH (ref 11.5–14.5)
WBC: 12.4 10*3/uL — AB (ref 3.6–11.0)

## 2017-03-16 LAB — TROPONIN I
TROPONIN I: 0.08 ng/mL — AB (ref <0.03)
TROPONIN I: 0.04 ng/mL — AB (ref <0.03)

## 2017-03-16 LAB — BRAIN NATRIURETIC PEPTIDE: B NATRIURETIC PEPTIDE 5: 387 pg/mL — AB (ref 0.0–100.0)

## 2017-03-16 MED ORDER — FUROSEMIDE 10 MG/ML IJ SOLN
20.0000 mg | Freq: Once | INTRAMUSCULAR | Status: AC
  Administered 2017-03-16: 20 mg via INTRAVENOUS
  Filled 2017-03-16: qty 4

## 2017-03-16 MED ORDER — DOXYCYCLINE HYCLATE 100 MG PO CAPS: 100.0000 mg | ORAL_CAPSULE | Freq: Two times a day (BID) | ORAL | 0 refills | Status: DC

## 2017-03-16 MED ORDER — FUROSEMIDE 20 MG PO TABS: 20.0000 mg | ORAL_TABLET | Freq: Every day | ORAL | 0 refills | Status: DC

## 2017-03-16 MED ORDER — DOXYCYCLINE HYCLATE 100 MG PO TABS
100.0000 mg | ORAL_TABLET | Freq: Once | ORAL | Status: AC
  Administered 2017-03-16: 100 mg via ORAL
  Filled 2017-03-16: qty 1

## 2017-03-16 NOTE — ED Notes (Signed)
Pt to POV with daughter via her wheelchair. NAD. VSS. Resp non-labored and equal. No questions or concerns voiced during triage.

## 2017-03-16 NOTE — ED Triage Notes (Signed)
Pt to ED with family is caregiver for her. Per family PT was out to see pt today and said that her blood pressure was low and her O2 sat was also low. Family member is unsure of reading. Pt denies feeling short of breath. Pt family states that pt has had a bad cough "for a long time". Pt is a smoker. Pt O2 sat in triage 93-94%. Pt in NAD at this time.

## 2017-03-16 NOTE — ED Notes (Signed)
Pt drinking coffee at this time. NAD. VSS. Family remains at bedside.

## 2017-03-16 NOTE — ED Notes (Signed)
Pt has non-productive cough. She is able to speak in complete sentences at this time.

## 2017-03-16 NOTE — ED Provider Notes (Signed)
[facility] Emergency Department Provider Note  ____________________________________________   I have reviewed the triage vital signs and the nursing notes.   HISTORY  Chief Complaint Hypotension   History limited by: Not Limited   HPI Tasha Grant is a 83 y.o. female who presents to the emergency department today because of concern for low blood pressure and hypoxia.   DURATION:noticed this morning SEVERITY: oxygen levels in the 80s CONTEXT: family states patient has had issues with cough/sob for years. This morning when physical therapy came noted that patient was hypoxic and hypotensive.  MODIFYING FACTORS: none ASSOCIATED SYMPTOMS: denies any fevers. Denies any new or changing shortness of breath. Denies any extremity swelling.   Per medical record review patient has a history of COPD.  Past Medical History:  Diagnosis Date  . Chronic low back pain without sciatica   . Diabetes mellitus without complication (HCC)   . Renal disorder     Patient Active Problem List   Diagnosis Date Noted  . Intractable back pain 02/19/2015  . Type 2 diabetes mellitus (HCC) 02/19/2015  . Hyperlipidemia 02/19/2015  . Hypothyroidism 02/19/2015  . Compression fracture of L2 lumbar vertebra with delayed healing 02/19/2015  . Constipation 02/19/2015  . Tobacco abuse disorder 02/19/2015  . Chronic renal insufficiency, stage III (moderate) (HCC) 02/19/2015    Past Surgical History:  Procedure Laterality Date  . JOINT REPLACEMENT    . KYPHOPLASTY N/A 02/23/2015   Procedure: KYPHOPLASTY T11;  Surgeon: Kennedy BuckerMichael Menz, MD;  Location: [facility] ORS;  Service: Orthopedics;  Laterality: N/A;  . TONSILLECTOMY      Prior to Admission medications   Medication Sig Start Date End Date Taking? Authorizing Provider  alendronate (FOSAMAX) 10 MG tablet Take 1 tablet (10 mg total) by mouth once a week. 02/23/15   Alford HighlandWieting, Richard, MD  ASMANEX 60 METERED DOSES 220 MCG/INH inhaler  Inhale 2 puffs into the lungs daily. 02/17/15   [provider]  atorvastatin (LIPITOR) 20 MG tablet Take 1 tablet by mouth daily. 02/17/15   [provider]  calcium-vitamin D (OSCAL WITH D) 500-200 MG-UNIT tablet Take 1 tablet by mouth 3 (three) times daily. 02/23/15   Alford HighlandWieting, Richard, MD  donepezil (ARICEPT) 10 MG tablet Take 1 tablet by mouth at bedtime.  02/17/15   [provider]  fluticasone (FLONASE) 50 MCG/ACT nasal spray Place 2 sprays into both nostrils daily. 01/24/15   [provider]  HYDROcodone-acetaminophen (NORCO/VICODIN) 5-325 MG tablet Take 1 tablet by mouth every 4 (four) hours as needed for moderate pain. 02/04/17   Cuthriell, Delorise RoyalsJonathan D, PA-C  insulin aspart (NOVOLOG) 100 UNIT/ML injection Check finger stick before meals.  For glucose of 121 to 150 give 1 unit.  151 to 200 give 2 units. 201-250 give 3 units.  251-300 give 5 units.  301-350 give 7 units; 351-400 give 9 units 02/23/15   Wieting, Richard, MD  JANUVIA 50 MG tablet Take 1 tablet by mouth daily. 02/17/15   [provider]  levothyroxine (SYNTHROID, LEVOTHROID) 75 MCG tablet Take 1 tablet by mouth daily. 02/17/15   [provider]  nicotine (NICODERM CQ - DOSED IN MG/24 HOURS) 21 mg/24hr patch Place 1 patch (21 mg total) onto the skin daily. 02/23/15   Alford HighlandWieting, Richard, MD  pioglitazone (ACTOS) 45 MG tablet Take 1 tablet by mouth daily. 02/17/15   [provider]  polyethylene glycol powder (GLYCOLAX/MIRALAX) powder Take 17 g by mouth daily as needed. 02/17/15   [provider]  predniSONE (DELTASONE) 50 MG tablet Take 1 tablet (50 mg total) by mouth daily with breakfast. 02/04/17   Cuthriell, Delorise RoyalsJonathan D, PA-C  VENTOLIN HFA 108 (90 BASE) MCG/ACT inhaler Inhale 2 puffs into the lungs every 4 (four) hours as needed. 02/17/15   [provider]    Allergies Codeine; Morphine and related; Quinapril; and Sulfa antibiotics  No family history on  file.  Social History Social History   Tobacco Use  . Smoking status: Current Every Day Smoker    Packs/day: 0.50  . Smokeless tobacco: Never Used  Substance Use Topics  . Alcohol use: No  . Drug use: Not on file    Review of Systems Constitutional: No fever/chills Eyes: No visual changes. ENT: No sore throat. Cardiovascular: Denies chest pain. Respiratory: Positive for chronic shortness of breath. Gastrointestinal: No abdominal pain.  No nausea, no vomiting.  No diarrhea.   Genitourinary: Negative for dysuria. Musculoskeletal: Negative for back pain. Skin: Negative for rash. Neurological: Negative for headaches, focal weakness or numbness.  ____________________________________________   PHYSICAL EXAM:  VITAL SIGNS: ED Triage Vitals  Enc Vitals Group     BP 03/16/17 1644 (!) 106/48     Pulse Rate 03/16/17 1605 74     Resp 03/16/17 1644 16     Temp 03/16/17 1644 97.9 F (36.6 C)     Temp Source 03/16/17 1644 Oral     SpO2 03/16/17 1605 96 %    Constitutional: Alert and oriented. Well appearing and in no distress. Eyes: Conjunctivae are normal.  ENT   Head: Normocephalic and atraumatic.   Nose: No congestion/rhinnorhea.   Mouth/Throat: Mucous membranes are moist.   Neck: No stridor. Hematological/Lymphatic/Immunilogical: No cervical lymphadenopathy. Cardiovascular: Normal rate, regular rhythm.  No murmurs, rubs, or gallops.  Respiratory: Normal respiratory effort without tachypnea nor retractions. Breath sounds are clear and equal bilaterally. No wheezes/rales/rhonchi. Gastrointestinal: Soft and non tender. No rebound. No guarding.  Genitourinary: Deferred Musculoskeletal: Normal range of motion in all extremities. No lower extremity edema. Neurologic:  Normal speech and language. No gross focal neurologic deficits are appreciated.  Skin:  Skin is warm, dry and intact. No rash noted. Psychiatric: Mood and affect are normal. Speech and behavior are  normal. Patient exhibits appropriate insight and judgment.  ____________________________________________    LABS (pertinent positives/negatives)  Trop 0.08 -> 0.04 BNP 387 Lactic 1.7 WBC 12.4, hgb 10.9   ____________________________________________   EKG  I, Phineas SemenGraydon Tilton Marsalis, attending physician, personally viewed and interpreted this EKG  EKG Time: 1745 Rate: 77 Rhythm: normal sinus rhythm Axis: normal Intervals: qtc 428 QRS: narrow, q waves v1, v2 ST changes: no st elevation Impression: abnormal ekg   ____________________________________________    RADIOLOGY  CXR Pulmonary edema. Question pneumonia vs nodules.  ____________________________________________   PROCEDURES  Procedures  ____________________________________________   INITIAL IMPRESSION / ASSESSMENT AND PLAN / ED COURSE  Pertinent labs & imaging results that were available during my care of the patient were reviewed by me and considered in my medical decision making (see chart for details).  Differential includes, but is not limited to, viral syndrome, bronchitis including COPD exacerbation, pneumonia, reactive airway disease including asthma, CHF including exacerbation with or without pulmonary/interstitial edema, pneumothorax, ACS, thoracic trauma, and pulmonary embolism. Workup is consistent with pulmonary edema elevated BNP.  Patient was given some Lasix here in the emergency department.  The patient had initial elevated troponin however repeat was much close to normal.  I think this more likely related pulmonary edema and fluid  overload and actual ACS given lack of chest pain.  Furthermore chest x-ray was concerning for possible pneumonia versus nodules.  This point will treat for pneumonia given mild elevation of white blood cells however patient is not febrile.  I did discuss with patient family importance of getting repeat imagings and recommended CT scan to evaluate for  resolution.  ____________________________________________   FINAL CLINICAL IMPRESSION(S) / ED DIAGNOSES  Final diagnoses:  Acute pulmonary edema (HCC)  Community acquired pneumonia, unspecified laterality     Note: This dictation was prepared with Office manager. Any transcriptional errors that result from this process are unintentional     Phineas Semen, MD 03/16/17 [phone]

## 2017-03-16 NOTE — ED Notes (Signed)
Date and time results received: 03/16/17 7:22 PM    Test: troponin Critical Value: 0.08  Name of Provider Notified: Dr. Derrill KayGoodman  Orders Received? Or Actions Taken?: Orders Received - See Orders for details

## 2017-03-16 NOTE — Discharge Instructions (Signed)
As we discussed it is important that you get follow up imaging of your lung (CT chest recommended) to evaluate for resolution of the pneumonia and findings on chest x-ray. Please seek medical attention for any high fevers, chest pain, shortness of breath, change in behavior, persistent vomiting, bloody stool or any other new or concerning symptoms.

## 2017-03-16 NOTE — ED Notes (Signed)
First Nurse Note:  [facility] oxygen saturation in the 70s,

## 2017-03-21 ENCOUNTER — Inpatient Hospital Stay
Admission: EM | Admit: 2017-03-21 | Discharge: 2017-03-28 | DRG: 190 | Disposition: A | Discharge status: Hospice | Payer: Medicare Other | Attending: Internal Medicine | Admitting: Internal Medicine

## 2017-03-21 ENCOUNTER — Inpatient Hospital Stay (HOSPITAL_COMMUNITY)
Admit: 2017-03-21 | Discharge: 2017-03-21 | Disposition: A | Discharge status: Home / Self Care | Payer: Medicare Other | Attending: Specialist | Admitting: Specialist

## 2017-03-21 ENCOUNTER — Encounter: Payer: Self-pay | Admitting: Emergency Medicine

## 2017-03-21 ENCOUNTER — Emergency Department: Payer: Medicare Other

## 2017-03-21 ENCOUNTER — Other Ambulatory Visit: Payer: Self-pay

## 2017-03-21 DIAGNOSIS — J441 Chronic obstructive pulmonary disease with (acute) exacerbation: Secondary | ICD-10-CM | POA: Diagnosis present

## 2017-03-21 DIAGNOSIS — I129 Hypertensive chronic kidney disease with stage 1 through stage 4 chronic kidney disease, or unspecified chronic kidney disease: Secondary | ICD-10-CM | POA: Diagnosis present

## 2017-03-21 DIAGNOSIS — E860 Dehydration: Secondary | ICD-10-CM | POA: Diagnosis present

## 2017-03-21 DIAGNOSIS — G8929 Other chronic pain: Secondary | ICD-10-CM | POA: Diagnosis present

## 2017-03-21 DIAGNOSIS — Z66 Do not resuscitate: Secondary | ICD-10-CM | POA: Diagnosis present

## 2017-03-21 DIAGNOSIS — F17210 Nicotine dependence, cigarettes, uncomplicated: Secondary | ICD-10-CM | POA: Diagnosis present

## 2017-03-21 DIAGNOSIS — I480 Paroxysmal atrial fibrillation: Secondary | ICD-10-CM | POA: Diagnosis not present

## 2017-03-21 DIAGNOSIS — Z515 Encounter for palliative care: Secondary | ICD-10-CM | POA: Diagnosis not present

## 2017-03-21 DIAGNOSIS — M810 Age-related osteoporosis without current pathological fracture: Secondary | ICD-10-CM | POA: Diagnosis present

## 2017-03-21 DIAGNOSIS — J9601 Acute respiratory failure with hypoxia: Secondary | ICD-10-CM | POA: Diagnosis present

## 2017-03-21 DIAGNOSIS — Z794 Long term (current) use of insulin: Secondary | ICD-10-CM

## 2017-03-21 DIAGNOSIS — Z993 Dependence on wheelchair: Secondary | ICD-10-CM | POA: Diagnosis not present

## 2017-03-21 DIAGNOSIS — Z79899 Other long term (current) drug therapy: Secondary | ICD-10-CM | POA: Diagnosis not present

## 2017-03-21 DIAGNOSIS — E039 Hypothyroidism, unspecified: Secondary | ICD-10-CM | POA: Diagnosis present

## 2017-03-21 DIAGNOSIS — Z7951 Long term (current) use of inhaled steroids: Secondary | ICD-10-CM

## 2017-03-21 DIAGNOSIS — R0602 Shortness of breath: Secondary | ICD-10-CM | POA: Diagnosis not present

## 2017-03-21 DIAGNOSIS — K219 Gastro-esophageal reflux disease without esophagitis: Secondary | ICD-10-CM | POA: Diagnosis present

## 2017-03-21 DIAGNOSIS — J189 Pneumonia, unspecified organism: Secondary | ICD-10-CM | POA: Diagnosis present

## 2017-03-21 DIAGNOSIS — N184 Chronic kidney disease, stage 4 (severe): Secondary | ICD-10-CM | POA: Diagnosis present

## 2017-03-21 DIAGNOSIS — Z9181 History of falling: Secondary | ICD-10-CM

## 2017-03-21 DIAGNOSIS — I959 Hypotension, unspecified: Secondary | ICD-10-CM | POA: Diagnosis present

## 2017-03-21 DIAGNOSIS — I481 Persistent atrial fibrillation: Secondary | ICD-10-CM | POA: Diagnosis present

## 2017-03-21 DIAGNOSIS — I4891 Unspecified atrial fibrillation: Secondary | ICD-10-CM

## 2017-03-21 DIAGNOSIS — E1122 Type 2 diabetes mellitus with diabetic chronic kidney disease: Secondary | ICD-10-CM | POA: Diagnosis present

## 2017-03-21 DIAGNOSIS — R0600 Dyspnea, unspecified: Secondary | ICD-10-CM

## 2017-03-21 DIAGNOSIS — Z7982 Long term (current) use of aspirin: Secondary | ICD-10-CM

## 2017-03-21 DIAGNOSIS — E11649 Type 2 diabetes mellitus with hypoglycemia without coma: Secondary | ICD-10-CM | POA: Diagnosis present

## 2017-03-21 DIAGNOSIS — R0603 Acute respiratory distress: Secondary | ICD-10-CM

## 2017-03-21 DIAGNOSIS — I248 Other forms of acute ischemic heart disease: Secondary | ICD-10-CM | POA: Diagnosis present

## 2017-03-21 DIAGNOSIS — M545 Low back pain: Secondary | ICD-10-CM | POA: Diagnosis present

## 2017-03-21 DIAGNOSIS — N179 Acute kidney failure, unspecified: Secondary | ICD-10-CM | POA: Diagnosis present

## 2017-03-21 DIAGNOSIS — E785 Hyperlipidemia, unspecified: Secondary | ICD-10-CM | POA: Diagnosis present

## 2017-03-21 DIAGNOSIS — J440 Chronic obstructive pulmonary disease with acute lower respiratory infection: Secondary | ICD-10-CM | POA: Diagnosis present

## 2017-03-21 DIAGNOSIS — Z79890 Hormone replacement therapy (postmenopausal): Secondary | ICD-10-CM | POA: Diagnosis not present

## 2017-03-21 DIAGNOSIS — J9621 Acute and chronic respiratory failure with hypoxia: Secondary | ICD-10-CM | POA: Diagnosis present

## 2017-03-21 LAB — CBC WITH DIFFERENTIAL/PLATELET
BASOS ABS: 0.1 10*3/uL (ref 0–0.1)
BASOS PCT: 0 %
EOS ABS: 0 10*3/uL (ref 0–0.7)
EOS PCT: 0 %
HCT: 33.9 % — ABNORMAL LOW (ref 35.0–47.0)
Hemoglobin: 10.8 g/dL — ABNORMAL LOW (ref 12.0–16.0)
LYMPHS PCT: 4 %
Lymphs Abs: 0.7 10*3/uL — ABNORMAL LOW (ref 1.0–3.6)
MCH: 25.4 pg — ABNORMAL LOW (ref 26.0–34.0)
MCHC: 31.7 g/dL — ABNORMAL LOW (ref 32.0–36.0)
MCV: 80.2 fL (ref 80.0–100.0)
Monocytes Absolute: 0.5 10*3/uL (ref 0.2–0.9)
Monocytes Relative: 3 %
Neutro Abs: 17.9 10*3/uL — ABNORMAL HIGH (ref 1.4–6.5)
Neutrophils Relative %: 93 %
PLATELETS: 339 10*3/uL (ref 150–440)
RBC: 4.23 MIL/uL (ref 3.80–5.20)
RDW: 18.6 % — ABNORMAL HIGH (ref 11.5–14.5)
WBC: 19.1 10*3/uL — AB (ref 3.6–11.0)

## 2017-03-21 LAB — GLUCOSE, CAPILLARY
Glucose-Capillary: 209 mg/dL — ABNORMAL HIGH (ref 65–99)
Glucose-Capillary: 343 mg/dL — ABNORMAL HIGH (ref 65–99)

## 2017-03-21 LAB — BASIC METABOLIC PANEL
Anion gap: 15 (ref 5–15)
BUN: 69 mg/dL — ABNORMAL HIGH (ref 6–20)
CALCIUM: 8.4 mg/dL — AB (ref 8.9–10.3)
CO2: 21 mmol/L — AB (ref 22–32)
CREATININE: 2.99 mg/dL — AB (ref 0.44–1.00)
Chloride: 102 mmol/L (ref 101–111)
GFR calc non Af Amer: 13 mL/min — ABNORMAL LOW (ref >60)
GFR, EST AFRICAN AMERICAN: 16 mL/min — AB (ref >60)
Glucose, Bld: 113 mg/dL — ABNORMAL HIGH (ref 65–99)
Potassium: 4.3 mmol/L (ref 3.5–5.1)
SODIUM: 138 mmol/L (ref 135–145)

## 2017-03-21 LAB — INFLUENZA PANEL BY PCR (TYPE A & B)
INFLAPCR: NEGATIVE
INFLBPCR: NEGATIVE

## 2017-03-21 LAB — TROPONIN I: TROPONIN I: 0.14 ng/mL — AB (ref <0.03)

## 2017-03-21 LAB — BRAIN NATRIURETIC PEPTIDE: B Natriuretic Peptide: 2116 pg/mL — ABNORMAL HIGH (ref 0.0–100.0)

## 2017-03-21 MED ORDER — AZITHROMYCIN 500 MG PO TABS
500.0000 mg | ORAL_TABLET | Freq: Once | ORAL | Status: AC
  Administered 2017-03-21: 500 mg via ORAL
  Filled 2017-03-21: qty 1

## 2017-03-21 MED ORDER — ONDANSETRON HCL 4 MG/2ML IJ SOLN: 4.0000 mg | Freq: Four times a day (QID) | INTRAMUSCULAR | Status: DC | PRN

## 2017-03-21 MED ORDER — IPRATROPIUM-ALBUTEROL 0.5-2.5 (3) MG/3ML IN SOLN
3.0000 mL | Freq: Once | RESPIRATORY_TRACT | Status: AC
  Administered 2017-03-21: 3 mL via RESPIRATORY_TRACT
  Filled 2017-03-21: qty 3

## 2017-03-21 MED ORDER — DILTIAZEM HCL 30 MG PO TABS
30.0000 mg | ORAL_TABLET | Freq: Four times a day (QID) | ORAL | Status: DC
  Administered 2017-03-21 – 2017-03-23 (×6): 30 mg via ORAL
  Filled 2017-03-21 (×7): qty 1

## 2017-03-21 MED ORDER — HEPARIN BOLUS VIA INFUSION
3200.0000 [IU] | Freq: Once | INTRAVENOUS | Status: AC
  Administered 2017-03-21: 3200 [IU] via INTRAVENOUS
  Filled 2017-03-21: qty 3200

## 2017-03-21 MED ORDER — FLUTICASONE PROPIONATE 50 MCG/ACT NA SUSP
2.0000 | Freq: Every day | NASAL | Status: DC
  Administered 2017-03-22 – 2017-03-26 (×5): 2 via NASAL
  Filled 2017-03-21: qty 16

## 2017-03-21 MED ORDER — METHYLPREDNISOLONE SODIUM SUCC 125 MG IJ SOLR
125.0000 mg | Freq: Once | INTRAMUSCULAR | Status: AC
  Administered 2017-03-21: 125 mg via INTRAVENOUS
  Filled 2017-03-21: qty 2

## 2017-03-21 MED ORDER — SODIUM CHLORIDE 0.9 % IV SOLN
INTRAVENOUS | Status: DC
  Administered 2017-03-21 – 2017-03-23 (×3): via INTRAVENOUS

## 2017-03-21 MED ORDER — ACETAMINOPHEN 325 MG PO TABS
650.0000 mg | ORAL_TABLET | Freq: Four times a day (QID) | ORAL | Status: DC | PRN
  Administered 2017-03-28: 650 mg via ORAL
  Filled 2017-03-21: qty 2

## 2017-03-21 MED ORDER — SODIUM CHLORIDE 0.9 % IV BOLUS (SEPSIS)
500.0000 mL | Freq: Once | INTRAVENOUS | Status: AC
  Administered 2017-03-21: 500 mL via INTRAVENOUS

## 2017-03-21 MED ORDER — METHYLPREDNISOLONE SODIUM SUCC 40 MG IJ SOLR
40.0000 mg | Freq: Four times a day (QID) | INTRAMUSCULAR | Status: DC
  Administered 2017-03-21 – 2017-03-26 (×19): 40 mg via INTRAVENOUS
  Filled 2017-03-21 (×19): qty 1

## 2017-03-21 MED ORDER — VITAMIN D 1000 UNITS PO TABS
2000.0000 [IU] | ORAL_TABLET | Freq: Every day | ORAL | Status: DC
  Administered 2017-03-22 – 2017-03-25 (×4): 2000 [IU] via ORAL
  Filled 2017-03-21 (×5): qty 2

## 2017-03-21 MED ORDER — ALENDRONATE SODIUM 10 MG PO TABS: 10.0000 mg | ORAL_TABLET | Freq: Every day | ORAL | Status: DC

## 2017-03-21 MED ORDER — ENOXAPARIN SODIUM 40 MG/0.4ML ~~LOC~~ SOLN: 40.0000 mg | SUBCUTANEOUS | Status: DC

## 2017-03-21 MED ORDER — ACETAMINOPHEN 650 MG RE SUPP: 650.0000 mg | Freq: Four times a day (QID) | RECTAL | Status: DC | PRN

## 2017-03-21 MED ORDER — GUAIFENESIN ER 600 MG PO TB12
600.0000 mg | ORAL_TABLET | Freq: Two times a day (BID) | ORAL | Status: DC
  Administered 2017-03-21 – 2017-03-28 (×13): 600 mg via ORAL
  Filled 2017-03-21 (×14): qty 1

## 2017-03-21 MED ORDER — INSULIN GLARGINE 100 UNIT/ML ~~LOC~~ SOLN
25.0000 [IU] | Freq: Every day | SUBCUTANEOUS | Status: DC
  Administered 2017-03-22 – 2017-03-26 (×5): 25 [IU] via SUBCUTANEOUS
  Filled 2017-03-21 (×6): qty 0.25

## 2017-03-21 MED ORDER — CEFTRIAXONE SODIUM IN DEXTROSE 20 MG/ML IV SOLN
1.0000 g | Freq: Once | INTRAVENOUS | Status: AC
  Administered 2017-03-21: 1 g via INTRAVENOUS
  Filled 2017-03-21: qty 50

## 2017-03-21 MED ORDER — LEVOTHYROXINE SODIUM 50 MCG PO TABS
75.0000 ug | ORAL_TABLET | Freq: Every day | ORAL | Status: DC
  Administered 2017-03-22 – 2017-03-28 (×7): 75 ug via ORAL
  Filled 2017-03-21 (×7): qty 1

## 2017-03-21 MED ORDER — INSULIN ASPART 100 UNIT/ML ~~LOC~~ SOLN
0.0000 [IU] | Freq: Every day | SUBCUTANEOUS | Status: DC
  Administered 2017-03-21: 4 [IU] via SUBCUTANEOUS
  Filled 2017-03-21: qty 1

## 2017-03-21 MED ORDER — SODIUM CHLORIDE 0.9% FLUSH
3.0000 mL | Freq: Two times a day (BID) | INTRAVENOUS | Status: DC
  Administered 2017-03-22 – 2017-03-28 (×14): 3 mL via INTRAVENOUS

## 2017-03-21 MED ORDER — PIOGLITAZONE HCL 15 MG PO TABS
45.0000 mg | ORAL_TABLET | Freq: Every day | ORAL | Status: DC
  Administered 2017-03-22 – 2017-03-24 (×3): 45 mg via ORAL
  Filled 2017-03-21 (×4): qty 3

## 2017-03-21 MED ORDER — INSULIN ASPART 100 UNIT/ML ~~LOC~~ SOLN
0.0000 [IU] | Freq: Three times a day (TID) | SUBCUTANEOUS | Status: DC
  Administered 2017-03-21: 3 [IU] via SUBCUTANEOUS
  Administered 2017-03-22: 7 [IU] via SUBCUTANEOUS
  Administered 2017-03-22: 2 [IU] via SUBCUTANEOUS
  Administered 2017-03-22: 5 [IU] via SUBCUTANEOUS
  Administered 2017-03-23: 1 [IU] via SUBCUTANEOUS
  Administered 2017-03-23 – 2017-03-24 (×4): 2 [IU] via SUBCUTANEOUS
  Administered 2017-03-26: 3 [IU] via SUBCUTANEOUS
  Administered 2017-03-26: 5 [IU] via SUBCUTANEOUS
  Administered 2017-03-27 (×2): 2 [IU] via SUBCUTANEOUS
  Administered 2017-03-27: 3 [IU] via SUBCUTANEOUS
  Filled 2017-03-21 (×14): qty 1

## 2017-03-21 MED ORDER — AMLODIPINE BESYLATE 5 MG PO TABS: 5.0000 mg | ORAL_TABLET | Freq: Every day | ORAL | Status: DC

## 2017-03-21 MED ORDER — MIRTAZAPINE 15 MG PO TBDP
15.0000 mg | ORAL_TABLET | Freq: Every day | ORAL | Status: DC
  Administered 2017-03-21 – 2017-03-27 (×7): 15 mg via ORAL
  Filled 2017-03-21 (×8): qty 1

## 2017-03-21 MED ORDER — HEPARIN SODIUM (PORCINE) 5000 UNIT/ML IJ SOLN: 5000.0000 [IU] | Freq: Three times a day (TID) | INTRAMUSCULAR | Status: DC

## 2017-03-21 MED ORDER — DEXTROSE 5 % IV SOLN
250.0000 mg | INTRAVENOUS | Status: DC
  Filled 2017-03-21: qty 250

## 2017-03-21 MED ORDER — HEPARIN (PORCINE) IN NACL 100-0.45 UNIT/ML-% IJ SOLN
750.0000 [IU]/h | INTRAMUSCULAR | Status: DC
  Administered 2017-03-21 – 2017-03-22 (×2): 900 [IU]/h via INTRAVENOUS
  Filled 2017-03-21 (×2): qty 250

## 2017-03-21 MED ORDER — BUDESONIDE 0.5 MG/2ML IN SUSP
0.5000 mg | Freq: Two times a day (BID) | RESPIRATORY_TRACT | Status: DC
  Administered 2017-03-21 – 2017-03-28 (×14): 0.5 mg via RESPIRATORY_TRACT
  Filled 2017-03-21 (×14): qty 2

## 2017-03-21 MED ORDER — DEXTROSE 5 % IV SOLN
1.0000 g | INTRAVENOUS | Status: AC
  Administered 2017-03-22 – 2017-03-25 (×4): 1 g via INTRAVENOUS
  Filled 2017-03-21 (×4): qty 10

## 2017-03-21 MED ORDER — ORAL CARE MOUTH RINSE
15.0000 mL | Freq: Two times a day (BID) | OROMUCOSAL | Status: DC
  Administered 2017-03-21 – 2017-03-22 (×3): 15 mL via OROMUCOSAL

## 2017-03-21 MED ORDER — IPRATROPIUM-ALBUTEROL 0.5-2.5 (3) MG/3ML IN SOLN
3.0000 mL | Freq: Four times a day (QID) | RESPIRATORY_TRACT | Status: DC
  Administered 2017-03-21 – 2017-03-28 (×26): 3 mL via RESPIRATORY_TRACT
  Filled 2017-03-21 (×27): qty 3

## 2017-03-21 MED ORDER — DOCUSATE SODIUM 100 MG PO CAPS
100.0000 mg | ORAL_CAPSULE | Freq: Two times a day (BID) | ORAL | Status: DC
  Administered 2017-03-21 – 2017-03-28 (×13): 100 mg via ORAL
  Filled 2017-03-21 (×14): qty 1

## 2017-03-21 MED ORDER — GUAIFENESIN-DM 100-10 MG/5ML PO SYRP
5.0000 mL | ORAL_SOLUTION | ORAL | Status: DC | PRN
  Administered 2017-03-21 – 2017-03-25 (×4): 5 mL via ORAL
  Filled 2017-03-21 (×6): qty 5

## 2017-03-21 MED ORDER — ONDANSETRON HCL 4 MG PO TABS: 4.0000 mg | ORAL_TABLET | Freq: Four times a day (QID) | ORAL | Status: DC | PRN

## 2017-03-21 MED ORDER — ASPIRIN EC 81 MG PO TBEC
81.0000 mg | DELAYED_RELEASE_TABLET | Freq: Every day | ORAL | Status: DC
  Administered 2017-03-22 – 2017-03-23 (×2): 81 mg via ORAL
  Filled 2017-03-21 (×2): qty 1

## 2017-03-21 MED ORDER — CHLORHEXIDINE GLUCONATE 0.12 % MT SOLN
15.0000 mL | Freq: Two times a day (BID) | OROMUCOSAL | Status: DC
  Administered 2017-03-21 – 2017-03-27 (×10): 15 mL via OROMUCOSAL
  Filled 2017-03-21 (×12): qty 15

## 2017-03-21 MED ORDER — GABAPENTIN 300 MG PO CAPS
300.0000 mg | ORAL_CAPSULE | Freq: Every day | ORAL | Status: DC
  Administered 2017-03-22 – 2017-03-28 (×7): 300 mg via ORAL
  Filled 2017-03-21 (×7): qty 1

## 2017-03-21 MED ORDER — PANTOPRAZOLE SODIUM 40 MG PO TBEC
40.0000 mg | DELAYED_RELEASE_TABLET | Freq: Every day | ORAL | Status: DC
  Administered 2017-03-22 – 2017-03-28 (×7): 40 mg via ORAL
  Filled 2017-03-21 (×7): qty 1

## 2017-03-21 NOTE — ED Notes (Signed)
Pt back from XR 

## 2017-03-21 NOTE — Care Management Note (Signed)
Case Management Note  Patient Details  Name: Lamar SprinklesMamie L Skerritt MRN: [id number] Date of Birth: March 15, 1931  Subjective/Objective:                 Patient failed outpatient treatment for pneumonia and copd.  Current oxygen requirement is acute. Also with new onset atrial fib which is being treated with oral cardizem. Receiving IV steroids, IV antibiotics and nebs. No issues accessing medical care, obtaining medications or with transportation.  Current with her PCP.    Action/Plan:  Maintain mobility and wean 02 and or home 02 assessment prior to dc.  Expected Discharge Date:  03/23/17               Expected Discharge Plan:     In-House Referral:     Discharge planning Services     Post Acute Care Choice:    Choice offered to:     DME Arranged:    DME Agency:     HH Arranged:    HH Agency:     Status of Service:     If discussed at [employer] Meetings, dates discussed:    Additional Comments:  Eber HongGreene, Muscab Brenneman R, RN 03/21/2017, 5:38 PM

## 2017-03-21 NOTE — Consult Note (Signed)
Cardiology Consultation:   Patient ID: Tasha Grant; [id number]; Jun 07, 1930   Admit date: 03/21/2017 Date of Consult: 03/21/2017  Primary Care Provider: Hyman Hopes, MD Primary Cardiologist: New to [facility]   Patient Profile:   Tasha Grant is a 83 y.o. female with a known history of COPD with ongoing tobacco abuse, diabetes, chronic back pain, osteoporosis, hypothyroidism, GERD  who is being seen today for the evaluation of atrial fibrillation and  at the request of Dr. Cherlynn Kaiser.  History of Present Illness:    Patient develop shortness of breath about 5-6 days ago per family at the bedside.  She was recently seen in the emergency room for shortness of breath and cough, discharged on doxycycline and Lasix   Family reports symptoms got worse this past week, anorexia, not drinking fluids, increasing cough and shortness of breath  brought to the ER for further evaluation.  noted to be in acute respiratory failure with hypoxia  Lab work reviewed,  noted to be in acute kidney injury mildly elevated troponin.  new onset atrial fibrillation.   Patient denies any chest pains, nausea, vomiting, abdominal pain,  lower extremity edema   Timing of atrial fibrillation unclear, she denies any tachycardia or palpitations  On admission started on low rate IV fluids 50 cc/h saline She continues to smoke, uses inhalers  Past Medical History:  Diagnosis Date  . Chronic low back pain without sciatica   . Diabetes mellitus without complication (HCC)   . Renal disorder     Past Surgical History:  Procedure Laterality Date  . JOINT REPLACEMENT    . KYPHOPLASTY N/A 02/23/2015   Procedure: KYPHOPLASTY T11;  Surgeon: Kennedy Bucker, MD;  Location: [facility] ORS;  Service: Orthopedics;  Laterality: N/A;  . TONSILLECTOMY       Home Medications:  Prior to Admission medications   Medication Sig Start Date End Date Taking? Authorizing Provider  acetaminophen (TYLENOL) 500 MG tablet Take 1,000 mg by  mouth every 8 (eight) hours as needed.   Yes [provider]  alendronate (FOSAMAX) 10 MG tablet Take 1 tablet (10 mg total) by mouth once a week. Patient taking differently: Take 10 mg by mouth daily.  02/23/15  Yes Wieting, Richard, MD  amLODipine (NORVASC) 5 MG tablet Take 5 mg by mouth daily. for high blood pressure 02/20/17  Yes [provider]  aspirin EC 81 MG tablet Take 1 tablet by mouth daily. 02/26/12  Yes [provider]  cholecalciferol (VITAMIN D) 1000 units tablet Take 2,000 Units by mouth at bedtime.   Yes [provider]  docusate sodium (COLACE) 100 MG capsule Take 100 mg by mouth 2 (two) times daily.   Yes [provider]  doxycycline (VIBRAMYCIN) 100 MG capsule Take 1 capsule (100 mg total) by mouth 2 (two) times daily for 7 days. 03/16/17 03/23/17 Yes Phineas Semen, MD  fluticasone [facility]) 50 MCG/ACT nasal spray Place 2 sprays into both nostrils daily. 01/24/15  Yes [provider]  furosemide (LASIX) 20 MG tablet Take 1 tablet (20 mg total) by mouth daily for 7 days. 03/16/17 03/23/17 Yes Phineas Semen, MD  gabapentin (NEURONTIN) 300 MG capsule Take 1 capsule by mouth daily. 12/31/16  Yes [provider]  Insulin Glargine (LANTUS SOLOSTAR) 100 UNIT/ML Solostar Pen Inject 25 Units into the skin daily. 04/01/14  Yes [provider]  JANUVIA 50 MG tablet Take 1 tablet by mouth daily. 02/17/15  Yes [provider]  levothyroxine (SYNTHROID, LEVOTHROID) 75 MCG  tablet Take 1 tablet by mouth daily. 02/17/15  Yes [provider]  Menthol-Zinc Oxide (CALMOSEPTINE) 0.44-20.6 % OINT Apply 1 application topically as needed (WITH EVERY DIAPER CHANGE).   Yes [provider]  mirtazapine (REMERON SOL-TAB) 15 MG disintegrating tablet Take 1 tablet by mouth at bedtime. 01/23/17  Yes [provider]  omeprazole (PRILOSEC) 20 MG capsule Take 1 capsule by mouth 2 (two) times daily. 01/23/17   Yes [provider]  pioglitazone (ACTOS) 45 MG tablet Take 1 tablet by mouth daily. 02/17/15  Yes [provider]  VENTOLIN HFA 108 (90 BASE) MCG/ACT inhaler Inhale 2 puffs into the lungs every 4 (four) hours as needed. 02/17/15  Yes [provider]  HYDROcodone-acetaminophen (NORCO/VICODIN) 5-325 MG tablet Take 1 tablet by mouth every 4 (four) hours as needed for moderate pain. Patient not taking: Reported on 03/16/2017 02/04/17   Cuthriell, Delorise RoyalsJonathan D, PA-C  insulin aspart (NOVOLOG) 100 UNIT/ML injection Check finger stick before meals.  For glucose of 121 to 150 give 1 unit.  151 to 200 give 2 units. 201-250 give 3 units.  251-300 give 5 units.  301-350 give 7 units; 351-400 give 9 units Patient not taking: Reported on 03/16/2017 02/23/15   Alford HighlandWieting, Richard, MD  nicotine (NICODERM CQ - DOSED IN MG/24 HOURS) 21 mg/24hr patch Place 1 patch (21 mg total) onto the skin daily. Patient not taking: Reported on 03/16/2017 02/23/15   Alford HighlandWieting, Richard, MD  nystatin cream (MYCOSTATIN) Apply 1 g topically 4 (four) times daily as needed. 03/14/17   [provider]  predniSONE (DELTASONE) 50 MG tablet Take 1 tablet (50 mg total) by mouth daily with breakfast. Patient not taking: Reported on 03/16/2017 02/04/17   Cuthriell, Delorise RoyalsJonathan D, PA-C    Inpatient Medications: Scheduled Meds: . [START ON 03/22/2017] amLODipine  5 mg Oral Daily  . [START ON 03/22/2017] aspirin EC  81 mg Oral Daily  . budesonide (PULMICORT) nebulizer solution  0.5 mg Nebulization BID  . chlorhexidine  15 mL Mouth Rinse BID  . cholecalciferol  2,000 Units Oral QHS  . diltiazem  30 mg Oral Q6H  . docusate sodium  100 mg Oral BID  . [START ON 03/22/2017] fluticasone  2 spray Each Nare Daily  . [START ON 03/22/2017] gabapentin  300 mg Oral Daily  . guaiFENesin  600 mg Oral BID  . insulin aspart  0-5 Units Subcutaneous QHS  . insulin aspart  0-9 Units Subcutaneous TID WC  . [START ON 03/22/2017]  insulin glargine  25 Units Subcutaneous Daily  . ipratropium-albuterol  3 mL Nebulization Q6H  . [START ON 03/22/2017] levothyroxine  75 mcg Oral QAC breakfast  . mouth rinse  15 mL Mouth Rinse q12n4p  . methylPREDNISolone (SOLU-MEDROL) injection  40 mg Intravenous Q6H  . mirtazapine  15 mg Oral QHS  . [START ON 03/22/2017] pantoprazole  40 mg Oral Daily  . [START ON 03/22/2017] pioglitazone  45 mg Oral Daily  . sodium chloride flush  3 mL Intravenous Q12H   Continuous Infusions: . sodium chloride 50 mL/hr at 03/21/17 1835  . [START ON 03/22/2017] azithromycin    . [START ON 03/22/2017] cefTRIAXone (ROCEPHIN)  IV    . heparin 900 Units/hr (03/21/17 2055)   PRN Meds: acetaminophen **OR** acetaminophen, guaiFENesin-dextromethorphan, ondansetron **OR** ondansetron (ZOFRAN) IV  Allergies:    Allergies  Allergen Reactions  . Codeine Itching  . Morphine And Related Itching  . Quinapril Other (See Comments)  . Sulfa Antibiotics Nausea Only  Social History:   Social History   Socioeconomic History  . Marital status: Married    Spouse name: Not on file  . Number of children: Not on file  . Years of education: Not on file  . Highest education level: Not on file  Social Needs  . Financial resource strain: Not on file  . Food insecurity - worry: Not on file  . Food insecurity - inability: Not on file  . Transportation needs - medical: Not on file  . Transportation needs - non-medical: Not on file  Occupational History  . Not on file  Tobacco Use  . Smoking status: Current Every Day Smoker    Packs/day: 0.50    Years: 60.00    Pack years: 30.00  . Smokeless tobacco: Never Used  Substance and Sexual Activity  . Alcohol use: No  . Drug use: No  . Sexual activity: Not on file  Other Topics Concern  . Not on file  Social History Narrative  . Not on file    Family History:    Family History  Problem Relation Age of Onset  . Anxiety disorder Mother   . Leukemia Father       ROS:  Please see the history of present illness.  Review of Systems  Constitution: Positive for weakness, malaise/fatigue and weight loss. Negative for diaphoresis, fever and night sweats.  HENT: Negative.   Eyes: Negative.   Cardiovascular: Negative for chest pain, claudication, cyanosis, dyspnea on exertion, irregular heartbeat, leg swelling, near-syncope, orthopnea, palpitations and paroxysmal nocturnal dyspnea.  Respiratory: Positive for cough and shortness of breath. Negative for sleep disturbances due to breathing and wheezing.   Endocrine: Negative.   Hematologic/Lymphatic: Negative.   Skin: Negative.   Musculoskeletal: Negative for falls, joint pain, joint swelling and myalgias.  Gastrointestinal: Negative.   Neurological: Negative for difficulty with concentration, excessive daytime sleepiness, dizziness, focal weakness, light-headedness and numbness.  Psychiatric/Behavioral: Negative.   All other ROS reviewed and negative.     Physical Exam/Data:   Vitals:   03/21/17 1600 03/21/17 1656 03/21/17 1704 03/21/17 2100  BP: (!) 82/51 (!) 117/53    Pulse: (!) 112 96    Resp: (!) 22 (!) 24    Temp:  98.5 F (36.9 C)    TempSrc:  Oral    SpO2: (!) 82% 93%  93%  Weight:   142 lb 9.6 oz (64.7 kg)   Height:   5\' 5"  (1.651 m)     Intake/Output Summary (Last 24 hours) at 03/21/2017 2153 Last data filed at 03/21/2017 1835 Gross per 24 hour  Intake 1565 ml  Output -  Net 1565 ml   Filed Weights   03/21/17 1233 03/21/17 1704  Weight: 155 lb (70.3 kg) 142 lb 9.6 oz (64.7 kg)   Body mass index is 23.73 kg/m.  General: Appears thin, mild respiratory distress, supine in bed With significant cough, wheezing HEENT: normal Lymph: no adenopathy Neck: no JVD Endocrine:  No thryomegaly Vascular: No carotid bruits; FA pulses 2+ bilaterally without bruits  Cardiac: Irregularly irregular RRR; no murmur  Lungs:  clear to auscultation bilaterally, + wheezing, rhonchi or rales    Abd: soft, nontender, no hepatomegaly  Ext: no edema Musculoskeletal:  No deformities, BUE and BLE strength normal and equal Skin: warm and dry  Neuro:  CNs 2-12 intact, no focal abnormalities noted Psych:  Normal affect   EKG:  The EKG was personally reviewed and demonstrates: Atrial fibrillation with no significant ST or T  wave changes Telemetry:  Telemetry was personally reviewed and demonstrates: Atrial fibrillation  Relevant CV Studies: Echocardiogram pending  Laboratory Data:  Chemistry Recent Labs  Lab 03/21/17 1237  NA 138  K 4.3  CL 102  CO2 21*  GLUCOSE 113*  BUN 69*  CREATININE 2.99*  CALCIUM 8.4*  GFRNONAA 13*  GFRAA 16*  ANIONGAP 15    No results for input(s): PROT, ALBUMIN, AST, ALT, ALKPHOS, BILITOT in the last 168 hours. Hematology Recent Labs  Lab 03/16/17 1807 03/21/17 1237  WBC 12.4* 19.1*  RBC 4.25 4.23  HGB 10.9* 10.8*  HCT 34.6* 33.9*  MCV 81.4 80.2  MCH 25.7* 25.4*  MCHC 31.6* 31.7*  RDW 18.8* 18.6*  PLT 235 339   Cardiac Enzymes Recent Labs  Lab 03/16/17 1807 03/16/17 2043 03/21/17 1237  TROPONINI 0.08* 0.04* 0.14*   No results for input(s): TROPIPOC in the last 168 hours.  BNP Recent Labs  Lab 03/16/17 1807 03/21/17 1237  BNP 387.0* 2,116.0*    DDimer No results for input(s): DDIMER in the last 168 hours.  Radiology/Studies:  Dg Chest 2 View  Result Date: 03/21/2017 CLINICAL DATA:  Pt went to the dr today and they couldn't get an O2 reading. She also has a cough. Hx of diabetes. Current everyday smoker EXAM: CHEST  2 VIEW COMPARISON:  03/16/2017 FINDINGS: Heart size is normal. There are coarse interstitial markings, stable in appearance. There is progressive opacity at the left lung base, suspicious for acute infectious infiltrate. Early right lower lobe infiltrate is suspected. IMPRESSION: 1. Increasing airspace filling opacities in the bases, consistent with bilateral lower lobe infiltrates, increased bilaterally since  prior study. 2. Chronic changes of fibrotic lung disease. Electronically Signed   By: Norva Pavlov M.D.   On: 03/21/2017 14:35    Assessment and Plan:   1. Acute respiratory distress Secondary to COPD exacerbation,  Elevated white count, worsening cough, hypoxia Started on broad-spectrum antibiotics, nebulizers, steroids  2.  Elevated troponin Demand ischemia in the setting of atrial fibrillation, hypotension from dehydration Likely has underlying coronary disease given long history of smoking This is not a non-STEMI No ischemic workup planned at this time given severity of underlying lung disease, persistent coughing, will be unable to lay flat for stress testing at this time  3.  New atrial fibrillation Presentation in the past week given EKG October 23 was in normal sinus rhythm Elevated chads vasc , will start heparin infusion Will need to monitor renal function to determine if she is a candidate for a noac Plan will be to work on rhythm control at a later date, Rate control for now with diltiazem oral dosing as blood pressure tolerates  4.  Acute on chronic renal failure Appears to have stage IIIb at baseline, creatinine 1.5 After Lasix, anorexia/poor fluid intake, now with dramatic rise in creatinine up to 3. She has been started on normal saline IV fluid infusion  5.  Diabetes Per medicine, sliding scale  6.  Active smoker Long discussion concerning smoking cessation techniques Will continue to address with her   Total encounter time more than 110 minutes  Greater than 50% was spent in counseling and coordination of care with the patient  For questions or updates, please contact [facility] Please consult [email or web address] for contact info under Cardiology/STEMI.   Signed, Julien Nordmann, MD  03/21/2017 9:53 PM

## 2017-03-21 NOTE — ED Notes (Signed)
Jeannett SeniorStephen, RN to transport pt to 2A 250.

## 2017-03-21 NOTE — Progress Notes (Addendum)
ANTICOAGULATION CONSULT NOTE - Initial Consult  Pharmacy Consult for Heparin Indication: atrial fibrillation  Allergies  Allergen Reactions  . Codeine Itching  . Morphine And Related Itching  . Quinapril Other (See Comments)  . Sulfa Antibiotics Nausea Only    Patient Measurements: Height: 5\' 5"  (165.1 cm) Weight: 142 lb 9.6 oz (64.7 kg) IBW/kg (Calculated) : 57 Heparin Dosing Weight: 64.7  Vital Signs: Temp: 98.5 F (36.9 C) (11/28 1656) Temp Source: Oral (11/28 1656) BP: 117/53 (11/28 1656) Pulse Rate: 96 (11/28 1656)  Labs: Recent Labs    03/21/17 1237  HGB 10.8*  HCT 33.9*  PLT 339  CREATININE 2.99*  TROPONINI 0.14*    Estimated Creatinine Clearance: 12.6 mL/min (A) (by C-G formula based on SCr of 2.99 mg/dL (H)).   Medical History: Past Medical History:  Diagnosis Date  . Chronic low back pain without sciatica   . Diabetes mellitus without complication (HCC)   . Renal disorder     Medications:  Medications Prior to Admission  Medication Sig Dispense Refill Last Dose  . acetaminophen (TYLENOL) 500 MG tablet Take 1,000 mg by mouth every 8 (eight) hours as needed.   PRN at PRN  . alendronate (FOSAMAX) 10 MG tablet Take 1 tablet (10 mg total) by mouth once a week. (Patient taking differently: Take 10 mg by mouth daily. ) 4 tablet 0 03/21/2017 at AM  . amLODipine (NORVASC) 5 MG tablet Take 5 mg by mouth daily. for high blood pressure  3 03/21/2017 at AM  . aspirin EC 81 MG tablet Take 1 tablet by mouth daily.   03/21/2017 at AM  . cholecalciferol (VITAMIN D) 1000 units tablet Take 2,000 Units by mouth at bedtime.   03/20/2017 at 2000  . docusate sodium (COLACE) 100 MG capsule Take 100 mg by mouth 2 (two) times daily.   PRN at PRN  . doxycycline (VIBRAMYCIN) 100 MG capsule Take 1 capsule (100 mg total) by mouth 2 (two) times daily for 7 days. 14 capsule 0 03/21/2017 at AM  . fluticasone (FLONASE) 50 MCG/ACT nasal spray Place 2 sprays into both nostrils daily.    03/21/2017 at AM  . furosemide (LASIX) 20 MG tablet Take 1 tablet (20 mg total) by mouth daily for 7 days. 7 tablet 0 03/21/2017 at AM  . gabapentin (NEURONTIN) 300 MG capsule Take 1 capsule by mouth daily.  3 03/21/2017 at AM  . Insulin Glargine (LANTUS SOLOSTAR) 100 UNIT/ML Solostar Pen Inject 25 Units into the skin daily.   03/21/2017 at N/A  . JANUVIA 50 MG tablet Take 1 tablet by mouth daily.   03/21/2017 at AM  . levothyroxine (SYNTHROID, LEVOTHROID) 75 MCG tablet Take 1 tablet by mouth daily.   03/21/2017 at AM  . Menthol-Zinc Oxide (CALMOSEPTINE) 0.44-20.6 % OINT Apply 1 application topically as needed (WITH EVERY DIAPER CHANGE).   PRN at PRN  . mirtazapine (REMERON SOL-TAB) 15 MG disintegrating tablet Take 1 tablet by mouth at bedtime.  3 03/20/2017 at 2000  . omeprazole (PRILOSEC) 20 MG capsule Take 1 capsule by mouth 2 (two) times daily.  3 03/21/2017 at AM  . pioglitazone (ACTOS) 45 MG tablet Take 1 tablet by mouth daily.   03/21/2017 at AM  . VENTOLIN HFA 108 (90 BASE) MCG/ACT inhaler Inhale 2 puffs into the lungs every 4 (four) hours as needed.   PRN at PRN  . HYDROcodone-acetaminophen (NORCO/VICODIN) 5-325 MG tablet Take 1 tablet by mouth every 4 (four) hours as needed for moderate pain. (  Patient not taking: Reported on 03/16/2017) 16 tablet 0 Not Taking at D/C  . insulin aspart (NOVOLOG) 100 UNIT/ML injection Check finger stick before meals.  For glucose of 121 to 150 give 1 unit.  151 to 200 give 2 units. 201-250 give 3 units.  251-300 give 5 units.  301-350 give 7 units; 351-400 give 9 units (Patient not taking: Reported on 03/16/2017) 10 mL 0 Not Taking at D/C  . nicotine (NICODERM CQ - DOSED IN MG/24 HOURS) 21 mg/24hr patch Place 1 patch (21 mg total) onto the skin daily. (Patient not taking: Reported on 03/16/2017) 28 patch 0 Not Taking at D/C  . nystatin cream (MYCOSTATIN) Apply 1 g topically 4 (four) times daily as needed.  1 PRN at PRN  . predniSONE (DELTASONE) 50 MG tablet  Take 1 tablet (50 mg total) by mouth daily with breakfast. (Patient not taking: Reported on 03/16/2017) 5 tablet 0 Not Taking at D/C   Scheduled:  . [START ON 03/22/2017] amLODipine  5 mg Oral Daily  . [START ON 03/22/2017] aspirin EC  81 mg Oral Daily  . budesonide (PULMICORT) nebulizer solution  0.5 mg Nebulization BID  . chlorhexidine  15 mL Mouth Rinse BID  . cholecalciferol  2,000 Units Oral QHS  . diltiazem  30 mg Oral Q6H  . docusate sodium  100 mg Oral BID  . [START ON 03/22/2017] fluticasone  2 spray Each Nare Daily  . [START ON 03/22/2017] gabapentin  300 mg Oral Daily  . guaiFENesin  600 mg Oral BID  . heparin  3,200 Units Intravenous Once  . heparin subcutaneous  5,000 Units Subcutaneous Q8H  . insulin aspart  0-5 Units Subcutaneous QHS  . insulin aspart  0-9 Units Subcutaneous TID WC  . [START ON 03/22/2017] insulin glargine  25 Units Subcutaneous Daily  . ipratropium-albuterol  3 mL Nebulization Q6H  . [START ON 03/22/2017] levothyroxine  75 mcg Oral QAC breakfast  . mouth rinse  15 mL Mouth Rinse q12n4p  . methylPREDNISolone (SOLU-MEDROL) injection  40 mg Intravenous Q6H  . mirtazapine  15 mg Oral QHS  . [START ON 03/22/2017] pantoprazole  40 mg Oral Daily  . [START ON 03/22/2017] pioglitazone  45 mg Oral Daily  . sodium chloride flush  3 mL Intravenous Q12H    Assessment: Pharmacy consulted to dose and monitor Heparin in this 83 year old female with atrial fibrillation . Goal of Therapy:  Heparin level 0.3-0.7 Monitor platelets by anticoagulation protocol: Yes   Plan:  Give 3200 units bolus x 1 Start heparin infusion at 900 units/hr Check anti-Xa level in 8 hours and daily while on heparin Continue to monitor H&H and platelets   Heparin level ordered to be drawn @ 0400.   James,Teldrin D 03/21/2017,7:06 PM    11/29 0400 HL 0.64, in therapeutic range. Hgb has dropped by ~1.6 since yesterday, but no overt bleeding per RN.  Will continue current regimen and  recheck level in 8 hours.  Fulton ReekMatt Giannie Soliday, PharmD, BCPS  03/22/17 5:26 AM

## 2017-03-21 NOTE — ED Notes (Signed)
Patient transported to XR. 

## 2017-03-21 NOTE — H&P (Signed)
[employer] - [place] [facility]   PATIENT NAME: Tasha Grant    MR#:  [id number]  DATE OF BIRTH:  Aug 01, 1930  DATE OF ADMISSION:  03/21/2017  PRIMARY CARE PHYSICIAN: Hyman Hopes, MD   REQUESTING/REFERRING PHYSICIAN: Dr. Nita Sickle  CHIEF COMPLAINT:   Chief Complaint  Patient presents with  . Shortness of Breath    HISTORY OF PRESENT ILLNESS:  Tasha Grant  is a 83 y.o. female with a known history of COPD with ongoing tobacco abuse, diabetes, chronic back pain, osteoporosis, hypothyroidism, GERD who presents to the hospital due to shortness of breath. Patient develop shortness of breath about 5-6 days ago and was in the ER diagnosed with a pneumonia discharged on some oral doxycycline and also Lasix. As per the daughter patient has not improved and she has significant shortness of breath and cough with minimal exertion. She's had no appetite with the past few days and therefore was brought to the ER for further evaluation. Patient was noted to be in acute respiratory failure with hypoxia secondary to COPD exacerbation from pneumonia. She was also noted to be in acute kidney injury and also with a mildly elevated troponin. She was also noted to be in new onset atrial fibrillation. Hospitalist services were contacted further treatment and evaluation. Patient denies any chest pains, nausea, vomiting, abdominal pain, paroxysmal nocturnal dyspnea, orthopnea, lower extremity edema or any other associated symptoms.  PAST MEDICAL HISTORY:   Past Medical History:  Diagnosis Date  . Chronic low back pain without sciatica   . Diabetes mellitus without complication (HCC)   . Renal disorder     PAST SURGICAL HISTORY:   Past Surgical History:  Procedure Laterality Date  . JOINT REPLACEMENT    . KYPHOPLASTY N/A 02/23/2015   Procedure: KYPHOPLASTY T11;  Surgeon: Kennedy Bucker, MD;  Location: [facility] ORS;  Service: Orthopedics;  Laterality: N/A;  . TONSILLECTOMY       SOCIAL HISTORY:   Social History   Tobacco Use  . Smoking status: Current Every Day Smoker    Packs/day: 0.50    Years: 60.00    Pack years: 30.00  . Smokeless tobacco: Never Used  Substance Use Topics  . Alcohol use: No    FAMILY HISTORY:   Family History  Problem Relation Age of Onset  . Anxiety disorder Mother   . Leukemia Father     DRUG ALLERGIES:   Allergies  Allergen Reactions  . Codeine Itching  . Morphine And Related Itching  . Quinapril Other (See Comments)  . Sulfa Antibiotics Nausea Only    REVIEW OF SYSTEMS:   Review of Systems  Constitutional: Negative for fever and weight loss.  HENT: Negative for congestion, nosebleeds and tinnitus.   Eyes: Negative for blurred vision, double vision and redness.  Respiratory: Positive for cough, sputum production, shortness of breath and wheezing. Negative for hemoptysis.   Cardiovascular: Negative for chest pain, orthopnea, leg swelling and PND.  Gastrointestinal: Negative for abdominal pain, diarrhea, melena, nausea and vomiting.  Genitourinary: Negative for dysuria, hematuria and urgency.  Musculoskeletal: Negative for falls and joint pain.  Neurological: Negative for dizziness, tingling, sensory change, focal weakness, seizures, weakness and headaches.  Endo/Heme/Allergies: Negative for polydipsia. Does not bruise/bleed easily.  Psychiatric/Behavioral: Negative for depression and memory loss. The patient is not nervous/anxious.     MEDICATIONS AT HOME:   Prior to Admission medications   Medication Sig Start Date End Date Taking? Authorizing Provider  acetaminophen (TYLENOL) 500 MG tablet  Take 1,000 mg by mouth every 8 (eight) hours as needed.   Yes [provider]  alendronate (FOSAMAX) 10 MG tablet Take 1 tablet (10 mg total) by mouth once a week. Patient taking differently: Take 10 mg by mouth daily.  02/23/15  Yes Wieting, Richard, MD  amLODipine (NORVASC) 5 MG tablet Take 5 mg by mouth  daily. for high blood pressure 02/20/17  Yes [provider]  aspirin EC 81 MG tablet Take 1 tablet by mouth daily. 02/26/12  Yes [provider]  cholecalciferol (VITAMIN D) 1000 units tablet Take 2,000 Units by mouth at bedtime.   Yes [provider]  docusate sodium (COLACE) 100 MG capsule Take 100 mg by mouth 2 (two) times daily.   Yes [provider]  doxycycline (VIBRAMYCIN) 100 MG capsule Take 1 capsule (100 mg total) by mouth 2 (two) times daily for 7 days. 03/16/17 03/23/17 Yes Phineas SemenGoodman, Graydon, MD  fluticasone [facility](FLONASE) 50 MCG/ACT nasal spray Place 2 sprays into both nostrils daily. 01/24/15  Yes [provider]  furosemide (LASIX) 20 MG tablet Take 1 tablet (20 mg total) by mouth daily for 7 days. 03/16/17 03/23/17 Yes Phineas SemenGoodman, Graydon, MD  gabapentin (NEURONTIN) 300 MG capsule Take 1 capsule by mouth daily. 12/31/16  Yes [provider]  Insulin Glargine (LANTUS SOLOSTAR) 100 UNIT/ML Solostar Pen Inject 25 Units into the skin daily. 04/01/14  Yes [provider]  JANUVIA 50 MG tablet Take 1 tablet by mouth daily. 02/17/15  Yes [provider]  levothyroxine (SYNTHROID, LEVOTHROID) 75 MCG tablet Take 1 tablet by mouth daily. 02/17/15  Yes [provider]  Menthol-Zinc Oxide (CALMOSEPTINE) 0.44-20.6 % OINT Apply 1 application topically as needed (WITH EVERY DIAPER CHANGE).   Yes [provider]  mirtazapine (REMERON SOL-TAB) 15 MG disintegrating tablet Take 1 tablet by mouth at bedtime. 01/23/17  Yes [provider]  omeprazole (PRILOSEC) 20 MG capsule Take 1 capsule by mouth 2 (two) times daily. 01/23/17  Yes [provider]  pioglitazone (ACTOS) 45 MG tablet Take 1 tablet by mouth daily. 02/17/15  Yes [provider]  VENTOLIN HFA 108 (90 BASE) MCG/ACT inhaler Inhale 2 puffs into the lungs every 4 (four) hours as needed. 02/17/15  Yes [provider]   HYDROcodone-acetaminophen (NORCO/VICODIN) 5-325 MG tablet Take 1 tablet by mouth every 4 (four) hours as needed for moderate pain. Patient not taking: Reported on 03/16/2017 02/04/17   Cuthriell, Delorise RoyalsJonathan D, PA-C  insulin aspart (NOVOLOG) 100 UNIT/ML injection Check finger stick before meals.  For glucose of 121 to 150 give 1 unit.  151 to 200 give 2 units. 201-250 give 3 units.  251-300 give 5 units.  301-350 give 7 units; 351-400 give 9 units Patient not taking: Reported on 03/16/2017 02/23/15   Alford HighlandWieting, Richard, MD  nicotine (NICODERM CQ - DOSED IN MG/24 HOURS) 21 mg/24hr patch Place 1 patch (21 mg total) onto the skin daily. Patient not taking: Reported on 03/16/2017 02/23/15   Alford HighlandWieting, Richard, MD  nystatin cream (MYCOSTATIN) Apply 1 g topically 4 (four) times daily as needed. 03/14/17   [provider]  predniSONE (DELTASONE) 50 MG tablet Take 1 tablet (50 mg total) by mouth daily with breakfast. Patient not taking: Reported on 03/16/2017 02/04/17   Cuthriell, Delorise RoyalsJonathan D, PA-C      VITAL SIGNS:  Blood pressure (!) 121/58, pulse (!) 116, temperature 98.2 F (36.8 C), temperature source Oral, resp. rate (!) 35, weight 70.3 kg (155 lb), SpO2 93 %.  PHYSICAL EXAMINATION:  Physical Exam  GENERAL:  83 y.o.-year-old patient lying in the bed in mild Resp. distress.  EYES: Pupils equal, round, reactive to light and accommodation. No scleral icterus. Extraocular muscles intact.  HEENT: Head atraumatic, normocephalic. Oropharynx and nasopharynx clear. No oropharyngeal erythema, moist oral mucosa  NECK:  Supple, no jugular venous distention. No thyroid enlargement, no tenderness.  LUNGS: Good air entry bilaterally, diffuse rhonchi, wheezing bilaterally. Negative use of accessory muscles.  CARDIOVASCULAR: S1, S2 RRR. No murmurs, rubs, gallops, clicks.  ABDOMEN: Soft, nontender, nondistended. Bowel sounds present. No organomegaly or mass.  EXTREMITIES: No pedal edema, cyanosis, or  clubbing. + 2 pedal & radial pulses b/l.   NEUROLOGIC: Cranial nerves II through XII are intact. No focal Motor or sensory deficits appreciated b/l PSYCHIATRIC: The patient is alert and oriented x 3.  SKIN: No obvious rash, lesion, or ulcer.   LABORATORY PANEL:   CBC Recent Labs  Lab 03/21/17 1237  WBC 19.1*  HGB 10.8*  HCT 33.9*  PLT 339   ------------------------------------------------------------------------------------------------------------------  Chemistries  Recent Labs  Lab 03/21/17 1237  NA 138  K 4.3  CL 102  CO2 21*  GLUCOSE 113*  BUN 69*  CREATININE 2.99*  CALCIUM 8.4*   ------------------------------------------------------------------------------------------------------------------  Cardiac Enzymes Recent Labs  Lab 03/21/17 1237  TROPONINI 0.14*   ------------------------------------------------------------------------------------------------------------------  RADIOLOGY:  Dg Chest 2 View  Result Date: 03/21/2017 CLINICAL DATA:  Pt went to the dr today and they couldn't get an O2 reading. She also has a cough. Hx of diabetes. Current everyday smoker EXAM: CHEST  2 VIEW COMPARISON:  03/16/2017 FINDINGS: Heart size is normal. There are coarse interstitial markings, stable in appearance. There is progressive opacity at the left lung base, suspicious for acute infectious infiltrate. Early right lower lobe infiltrate is suspected. IMPRESSION: 1. Increasing airspace filling opacities in the bases, consistent with bilateral lower lobe infiltrates, increased bilaterally since prior study. 2. Chronic changes of fibrotic lung disease. Electronically Signed   By: Norva Pavlov M.D.   On: 03/21/2017 14:35     IMPRESSION AND PLAN:   83 year old female with past medical history of COPD with ongoing tobacco abuse, chronic back pain, osteoporosis, hypertension, diabetes who presents to the hospital due to shortness of breath cough and wheezing and noted to be in  acute respiratory failure with hypoxia.  1. Acute respiratory failure with hypoxia-secondary to COPD exacerbation. -We'll treat the patient with IV steroids, scheduled DuoNeb nebs, Pulmicort nebs and empiric antibiotics for community acquired pneumonia with ceftriaxone and Zithromax. -Assess the patient for home oxygen prior to discharge.  2. COPD exacerbation-as the cause of patient's worsening respiratory failure. Continue treatment as mentioned above. -Assess the patient's home oxygen prior to discharge.  3. Pneumonia-suspected to be community acquired pneumonia. We'll treat patient with IV ceftriaxone, Zithromax. Follow cultures.  4. Acute kidney injury-secondary to dehydration. -We'll gently hydrate the patient with IV fluids, follow BUN and creatinine. Hold Lasix for now.  5. New-onset atrial fibrillation-patient noted to be in A. Fib w. RVR.  - This could be related to the patient's respiratory illness as mentioned. We'll check two-dimensional echocardiogram, get a cardiology consult, cycle the patient's cardiac markers.   -Will place patient on low-dose Cardizem.  6. Elevated troponin-suspected to be seconded to supply demand ischemia from new-onset A. fib and hypoxemia. -We'll cycle patient's cardiac markers. Check echocardiogram, await cardiology input.  7. Essential hypertension-continue Norvasc.  8. Diabetes type 2 without complication-continue Lantus, sliding scale insulin, Actos.  9. GERD-continue Protonix.  10. Hypothyroidism-continue Synthroid.  All the records are reviewed and case discussed with ED provider. Management plans discussed with the patient, family and they are in agreement.  CODE STATUS: Full code  TOTAL TIME TAKING CARE OF THIS PATIENT: 45 minutes.    Houston SirenSAINANI,VIVEK J M.D on 03/21/2017 at 3:30 PM  Between 7am to 6pm - Pager - [phone]  After 6pm go to [email or web address] - password [email or web address] [facility]  [facility]  Office   [phone]  CC: Primary care physician; Hyman HopesBurns, Harriett P, MD

## 2017-03-21 NOTE — Progress Notes (Signed)

## 2017-03-21 NOTE — ED Provider Notes (Signed)
[facility] Emergency Department Provider Note  ____________________________________________  Time seen: Approximately 1:38 PM  I have reviewed the triage vital signs and the nursing notes.   HISTORY  Chief Complaint Shortness of Breath   HPI Tasha Grant is a 83 y.o. female the history of smoking, COPD, diabetes, hypothyroidism, hyperlipidemia, and chronic kidney disease and presents for evaluation of shortness of breath.patient was seen here 5 days ago and diagnosed with pulmonary edema and community acquired pneumonia. Pulmonary edema is a new diagnosis for patient as she does not have a history of CHF. She was sent home on doxycycline. Today she went to see her primary care doctor for follow-up because she was still feeling short of breath and coughing and was found to be hypoxic to the 60s on room air. Patient has had no fever. She has been complaining intermittently of chest pain but has no chest pain at this time. She continues to cough. No nausea, no vomiting, no diarrhea, no abdominal pain, no dysuria. No leg pain or swelling, no personal or family history of blood clots.  Past Medical History:  Diagnosis Date  . Chronic low back pain without sciatica   . Diabetes mellitus without complication (HCC)   . Renal disorder     Patient Active Problem List   Diagnosis Date Noted  . Intractable back pain 02/19/2015  . Type 2 diabetes mellitus (HCC) 02/19/2015  . Hyperlipidemia 02/19/2015  . Hypothyroidism 02/19/2015  . Compression fracture of L2 lumbar vertebra with delayed healing 02/19/2015  . Constipation 02/19/2015  . Tobacco abuse disorder 02/19/2015  . Chronic renal insufficiency, stage III (moderate) (HCC) 02/19/2015    Past Surgical History:  Procedure Laterality Date  . JOINT REPLACEMENT    . KYPHOPLASTY N/A 02/23/2015   Procedure: KYPHOPLASTY T11;  Surgeon: Kennedy Bucker, MD;  Location: [facility] ORS;  Service: Orthopedics;  Laterality: N/A;    . TONSILLECTOMY      Prior to Admission medications   Medication Sig Start Date End Date Taking? Authorizing Provider  acetaminophen (TYLENOL) 500 MG tablet Take 1,000 mg by mouth every 8 (eight) hours as needed.   Yes [provider]  alendronate (FOSAMAX) 10 MG tablet Take 1 tablet (10 mg total) by mouth once a week. Patient taking differently: Take 10 mg by mouth daily.  02/23/15  Yes Wieting, Richard, MD  amLODipine (NORVASC) 5 MG tablet Take 5 mg by mouth daily. for high blood pressure 02/20/17  Yes [provider]  aspirin EC 81 MG tablet Take 1 tablet by mouth daily. 02/26/12  Yes [provider]  cholecalciferol (VITAMIN D) 1000 units tablet Take 2,000 Units by mouth at bedtime.   Yes [provider]  docusate sodium (COLACE) 100 MG capsule Take 100 mg by mouth 2 (two) times daily.   Yes [provider]  doxycycline (VIBRAMYCIN) 100 MG capsule Take 1 capsule (100 mg total) by mouth 2 (two) times daily for 7 days. 03/16/17 03/23/17 Yes Phineas Semen, MD  fluticasone [facility]) 50 MCG/ACT nasal spray Place 2 sprays into both nostrils daily. 01/24/15  Yes [provider]  furosemide (LASIX) 20 MG tablet Take 1 tablet (20 mg total) by mouth daily for 7 days. 03/16/17 03/23/17 Yes Phineas Semen, MD  gabapentin (NEURONTIN) 300 MG capsule Take 1 capsule by mouth daily. 12/31/16  Yes [provider]  Insulin Glargine (LANTUS SOLOSTAR) 100 UNIT/ML Solostar Pen Inject 25 Units into the skin daily. 04/01/14  Yes [provider]  JANUVIA 50 MG tablet Take 1 tablet by mouth daily. 02/17/15  Yes [provider]  levothyroxine (SYNTHROID, LEVOTHROID) 75 MCG tablet Take 1 tablet by mouth daily. 02/17/15  Yes [provider]  Menthol-Zinc Oxide (CALMOSEPTINE) 0.44-20.6 % OINT Apply 1 application topically as needed (WITH EVERY DIAPER CHANGE).   Yes [provider]  mirtazapine (REMERON SOL-TAB) 15 MG  disintegrating tablet Take 1 tablet by mouth at bedtime. 01/23/17  Yes [provider]  omeprazole (PRILOSEC) 20 MG capsule Take 1 capsule by mouth 2 (two) times daily. 01/23/17  Yes [provider]  pioglitazone (ACTOS) 45 MG tablet Take 1 tablet by mouth daily. 02/17/15  Yes [provider]  VENTOLIN HFA 108 (90 BASE) MCG/ACT inhaler Inhale 2 puffs into the lungs every 4 (four) hours as needed. 02/17/15  Yes [provider]  HYDROcodone-acetaminophen (NORCO/VICODIN) 5-325 MG tablet Take 1 tablet by mouth every 4 (four) hours as needed for moderate pain. Patient not taking: Reported on 03/16/2017 02/04/17   Cuthriell, Delorise RoyalsJonathan D, PA-C  insulin aspart (NOVOLOG) 100 UNIT/ML injection Check finger stick before meals.  For glucose of 121 to 150 give 1 unit.  151 to 200 give 2 units. 201-250 give 3 units.  251-300 give 5 units.  301-350 give 7 units; 351-400 give 9 units Patient not taking: Reported on 03/16/2017 02/23/15   Alford HighlandWieting, Richard, MD  nicotine (NICODERM CQ - DOSED IN MG/24 HOURS) 21 mg/24hr patch Place 1 patch (21 mg total) onto the skin daily. Patient not taking: Reported on 03/16/2017 02/23/15   Alford HighlandWieting, Richard, MD  nystatin cream (MYCOSTATIN) Apply 1 g topically 4 (four) times daily as needed. 03/14/17   [provider]  predniSONE (DELTASONE) 50 MG tablet Take 1 tablet (50 mg total) by mouth daily with breakfast. Patient not taking: Reported on 03/16/2017 02/04/17   Cuthriell, Delorise RoyalsJonathan D, PA-C    Allergies Codeine; Morphine and related; Quinapril; and Sulfa antibiotics  No family history on file.  Social History Social History   Tobacco Use  . Smoking status: Current Every Day Smoker    Packs/day: 0.50  . Smokeless tobacco: Never Used  Substance Use Topics  . Alcohol use: No  . Drug use: Not on file    Review of Systems  Constitutional: Negative for fever. Eyes: Negative for visual changes. ENT: Negative for sore throat. Neck:  No neck pain  Cardiovascular: Negative for chest pain. Respiratory: + shortness of breath and cough Gastrointestinal: Negative for abdominal pain, vomiting or diarrhea. Genitourinary: Negative for dysuria. Musculoskeletal: Negative for back pain. Skin: Negative for rash. Neurological: Negative for headaches, weakness or numbness. Psych: No SI or HI  ____________________________________________   PHYSICAL EXAM:  VITAL SIGNS: ED Triage Vitals  Enc Vitals Group     BP 03/21/17 1232 131/66     Pulse Rate 03/21/17 1232 90     Resp 03/21/17 1232 (!) 33     Temp 03/21/17 1232 98.2 F (36.8 C)     Temp Source 03/21/17 1232 Oral     SpO2 03/21/17 1232 94 %     Weight 03/21/17 1233 155 lb (70.3 kg)     Height --      Head Circumference --      Peak Flow --      Pain Score 03/21/17 1316 0     Pain Loc --      Pain Edu? --      Excl. in GC? --     Constitutional: Alert and  oriented. Well appearing and in no apparent distress. HEENT:      Head: Normocephalic and atraumatic.         Eyes: Conjunctivae are normal. Sclera is non-icteric.       Mouth/Throat: Mucous membranes are moist.       Neck: Supple with no signs of meningismus. Cardiovascular: Regular rate and rhythm. No murmurs, gallops, or rubs. 2+ symmetrical distal pulses are present in all extremities. No JVD. Respiratory: tachypnea, increased work of breathing, hypoxic on room air, bilateral crackles and faint wheezes Gastrointestinal: Soft, non tender, and non distended with positive bowel sounds. No rebound or guarding. Musculoskeletal: Nontender with normal range of motion in all extremities. No edema, cyanosis, or erythema of extremities. Neurologic: Normal speech and language. Face is symmetric. Moving all extremities. No gross focal neurologic deficits are appreciated. Skin: Skin is warm, dry and intact. No rash noted. Psychiatric: Mood and affect are normal. Speech and behavior are  normal.  ____________________________________________   LABS (all labs ordered are listed, but only abnormal results are displayed)  Labs Reviewed  CBC WITH DIFFERENTIAL/PLATELET - Abnormal; Notable for the following components:      Result Value   WBC 19.1 (*)    Hemoglobin 10.8 (*)    HCT 33.9 (*)    MCH 25.4 (*)    MCHC 31.7 (*)    RDW 18.6 (*)    Neutro Abs 17.9 (*)    Lymphs Abs 0.7 (*)    All other components within normal limits  BASIC METABOLIC PANEL - Abnormal; Notable for the following components:   CO2 21 (*)    Glucose, Bld 113 (*)    BUN 69 (*)    Creatinine, Ser 2.99 (*)    Calcium 8.4 (*)    GFR calc non Af Amer 13 (*)    GFR calc Af Amer 16 (*)    All other components within normal limits  BRAIN NATRIURETIC PEPTIDE - Abnormal; Notable for the following components:   B Natriuretic Peptide 2,116.0 (*)    All other components within normal limits  TROPONIN I - Abnormal; Notable for the following components:   Troponin I 0.14 (*)    All other components within normal limits   ____________________________________________  EKG  ED ECG REPORT I, Nita Sicklearolina Lakethia Coppess, the attending physician, personally viewed and interpreted this ECG.  Atrial fibrillation, rate of 107, normal intervals, normal axis, no ST elevations or depressions. A. fib is new when compared to prior. ____________________________________________  RADIOLOGY  CXR:  1. Increasing airspace filling opacities in the bases, consistent with bilateral lower lobe infiltrates, increased bilaterally since prior study. 2. Chronic changes of fibrotic lung disease. ____________________________________________   PROCEDURES  Procedure(s) performed: None Procedures Critical Care performed: yes  CRITICAL CARE Performed by: Nita Sicklearolina Edee Nifong  ?  Total critical care time: 40 min  Critical care time was exclusive of separately billable procedures and treating other patients.  Critical care was  necessary to treat or prevent imminent or life-threatening deterioration.  Critical care was time spent personally by me on the following activities: development of treatment plan with patient and/or surrogate as well as nursing, discussions with consultants, evaluation of patient's response to treatment, examination of patient, obtaining history from patient or surrogate, ordering and performing treatments and interventions, ordering and review of laboratory studies, ordering and review of radiographic studies, pulse oximetry and re-evaluation of patient's condition.  ____________________________________________   INITIAL IMPRESSION / ASSESSMENT AND PLAN / ED COURSE  83 y.o. female  the history of smoking, COPD, diabetes, hypothyroidism, hyperlipidemia, and chronic kidney disease and presents for evaluation of shortness of breath, cough, and hypoxia. patient is currently on doxycycline for community-acquired pneumonia that was diagnosed 5 days ago. found hypoxic at PCPs office. Upon arrival patient is hypoxic, tachypneic, with crackles and wheezes. Was started on DuoNeb and Solu-Medrol. Antibiotics were broaden to Rocephin and azithromycin. new onset of atrial fibrillation. Troponin is elevated at 0.14 consistent with a demand ischemia. IV fluids have been given. Chest x-ray shows worsening infiltrates. labs showing worse leukocytosis with white count of 19, and acute on chronic kidney injury with a creatinine of 2.99 (baseline is 1.4-1.6). Patient be admitted to the hospitalist service for acute hypoxic respiratory failure in the setting of pneumonia, a KI, new onset of A. fib.      As part of my medical decision making, I reviewed the following data within the electronic MEDICAL RECORD NUMBER History obtained from family, Nursing notes reviewed and incorporated, Labs reviewed , EKG interpreted , Old EKG reviewed, Old chart reviewed, Radiograph reviewed , Discussed with admitting physician , Notes from  prior ED visits and [place] Controlled Substance Database    Pertinent labs & imaging results that were available during my care of the patient were reviewed by me and considered in my medical decision making (see chart for details).    ____________________________________________   FINAL CLINICAL IMPRESSION(S) / ED DIAGNOSES  Final diagnoses:  Community acquired pneumonia, unspecified laterality  Demand ischemia of myocardium (HCC)  AKI (acute kidney injury) (HCC)  Acute respiratory failure with hypoxia (HCC)  Atrial fibrillation, unspecified type (HCC)      NEW MEDICATIONS STARTED DURING THIS VISIT:  ED Discharge Orders    None       Note:  This document was prepared using Dragon voice recognition software and may include unintentional dictation errors.    Nita Sickle, MD 03/21/17 [phone]

## 2017-03-21 NOTE — ED Triage Notes (Signed)
[facility] ed [facility] with reports of being dx with recent pne started on doxy and is no better. Ems reports upon arrival to drs office pt sats were 69% RA, 4 liters placed on pt and brought up to  98%. Pt lower ext contracted and pt is not ambulatory. Ems gave one duoneb tx in route and wheezing heart in lower lobes. Pt a/ox3 on arrival to ed.  Protocols initiated.

## 2017-03-21 NOTE — ED Notes (Signed)
Pt repositioned and reminded to keep arm as straight as possible so she receives entire antibiotic.

## 2017-03-22 LAB — HEPARIN LEVEL (UNFRACTIONATED)
HEPARIN UNFRACTIONATED: 0.64 [IU]/mL (ref 0.30–0.70)
Heparin Unfractionated: 0.47 IU/mL (ref 0.30–0.70)

## 2017-03-22 LAB — GLUCOSE, CAPILLARY
GLUCOSE-CAPILLARY: 266 mg/dL — AB (ref 65–99)
GLUCOSE-CAPILLARY: 177 mg/dL — AB (ref 65–99)
Glucose-Capillary: 348 mg/dL — ABNORMAL HIGH (ref 65–99)

## 2017-03-22 LAB — BASIC METABOLIC PANEL
ANION GAP: 11 (ref 5–15)
BUN: 72 mg/dL — ABNORMAL HIGH (ref 6–20)
CO2: 19 mmol/L — ABNORMAL LOW (ref 22–32)
Calcium: 7.7 mg/dL — ABNORMAL LOW (ref 8.9–10.3)
Chloride: 104 mmol/L (ref 101–111)
Creatinine, Ser: 2.72 mg/dL — ABNORMAL HIGH (ref 0.44–1.00)
GFR, EST AFRICAN AMERICAN: 17 mL/min — AB (ref >60)
GFR, EST NON AFRICAN AMERICAN: 15 mL/min — AB (ref >60)
GLUCOSE: 291 mg/dL — AB (ref 65–99)
POTASSIUM: 4.6 mmol/L (ref 3.5–5.1)
Sodium: 134 mmol/L — ABNORMAL LOW (ref 135–145)

## 2017-03-22 LAB — CBC
HEMATOCRIT: 29.1 % — AB (ref 35.0–47.0)
HEMOGLOBIN: 9.2 g/dL — AB (ref 12.0–16.0)
MCH: 25.5 pg — AB (ref 26.0–34.0)
MCHC: 31.7 g/dL — AB (ref 32.0–36.0)
MCV: 80.4 fL (ref 80.0–100.0)
Platelets: 305 10*3/uL (ref 150–440)
RBC: 3.62 MIL/uL — ABNORMAL LOW (ref 3.80–5.20)
RDW: 18.5 % — ABNORMAL HIGH (ref 11.5–14.5)
WBC: 11 10*3/uL (ref 3.6–11.0)

## 2017-03-22 LAB — MAGNESIUM: Magnesium: 1.9 mg/dL (ref 1.7–2.4)

## 2017-03-22 LAB — TSH: TSH: 0.913 u[IU]/mL (ref 0.350–4.500)

## 2017-03-22 LAB — ECHOCARDIOGRAM COMPLETE
Height: 65 in
WEIGHTICAEL: 2281.6 [oz_av]

## 2017-03-22 LAB — TROPONIN I: TROPONIN I: 0.08 ng/mL — AB (ref <0.03)

## 2017-03-22 MED ORDER — AZITHROMYCIN 250 MG PO TABS
250.0000 mg | ORAL_TABLET | Freq: Every day | ORAL | Status: AC
  Administered 2017-03-22 – 2017-03-25 (×4): 250 mg via ORAL
  Filled 2017-03-22 (×4): qty 1

## 2017-03-22 MED ORDER — NICOTINE 14 MG/24HR TD PT24
14.0000 mg | MEDICATED_PATCH | Freq: Every day | TRANSDERMAL | Status: DC
  Administered 2017-03-22 – 2017-03-28 (×6): 14 mg via TRANSDERMAL
  Filled 2017-03-22 (×7): qty 1

## 2017-03-22 MED ORDER — FLUCONAZOLE 100MG IVPB: 100.0000 mg | INTRAVENOUS | Status: DC

## 2017-03-22 MED ORDER — NEPRO/CARBSTEADY PO LIQD
237.0000 mL | Freq: Two times a day (BID) | ORAL | Status: DC
  Administered 2017-03-23 – 2017-03-27 (×7): 237 mL via ORAL

## 2017-03-22 MED ORDER — INSULIN ASPART 100 UNIT/ML ~~LOC~~ SOLN
5.0000 [IU] | Freq: Three times a day (TID) | SUBCUTANEOUS | Status: DC
  Administered 2017-03-22 – 2017-03-26 (×11): 5 [IU] via SUBCUTANEOUS
  Filled 2017-03-22 (×11): qty 1

## 2017-03-22 MED ORDER — VITAMIN C 500 MG PO TABS
500.0000 mg | ORAL_TABLET | Freq: Every day | ORAL | Status: DC
  Administered 2017-03-23 – 2017-03-25 (×2): 500 mg via ORAL
  Filled 2017-03-22 (×4): qty 1

## 2017-03-22 MED ORDER — FLUCONAZOLE 100MG IVPB
100.0000 mg | Freq: Once | INTRAVENOUS | Status: AC
  Administered 2017-03-22: 100 mg via INTRAVENOUS
  Filled 2017-03-22: qty 50

## 2017-03-22 MED ORDER — ADULT MULTIVITAMIN W/MINERALS CH
1.0000 | ORAL_TABLET | Freq: Every day | ORAL | Status: DC
  Administered 2017-03-23 – 2017-03-26 (×4): 1 via ORAL
  Filled 2017-03-22 (×4): qty 1

## 2017-03-22 NOTE — Progress Notes (Addendum)
[facility] - [place] [facility]   PATIENT NAME: Tasha LarocheMamie Grant    MR#:  [id number]  DATE OF BIRTH:  April 22, 1931  SUBJECTIVE:  CHIEF COMPLAINT:   Chief Complaint  Patient presents with  . Shortness of Breath   Still shortness of breath and cough, on O2  3L. REVIEW OF SYSTEMS:  Review of Systems  Constitutional: Positive for malaise/fatigue. Negative for chills and fever.  HENT: Negative for sore throat.   Eyes: Negative for blurred vision and double vision.  Respiratory: Positive for cough and shortness of breath. Negative for hemoptysis, wheezing and stridor.   Cardiovascular: Negative for chest pain, palpitations, orthopnea and leg swelling.  Gastrointestinal: Negative for abdominal pain, blood in stool, diarrhea, melena, nausea and vomiting.  Genitourinary: Negative for dysuria, flank pain and hematuria.  Musculoskeletal: Negative for back pain and joint pain.  Neurological: Positive for weakness. Negative for dizziness, sensory change, focal weakness, seizures, loss of consciousness and headaches.  Endo/Heme/Allergies: Negative for polydipsia.  Psychiatric/Behavioral: Negative for depression. The patient is not nervous/anxious.     DRUG ALLERGIES:   Allergies  Allergen Reactions  . Codeine Itching  . Morphine And Related Itching  . Quinapril Other (See Comments)  . Sulfa Antibiotics Nausea Only   VITALS:  Blood pressure (!) 115/56, pulse 82, temperature 97.8 F (36.6 C), temperature source Oral, resp. rate 17, height 5\' 5"  (1.651 m), weight 142 lb 9.6 oz (64.7 kg), SpO2 97 %. PHYSICAL EXAMINATION:  Physical Exam  Constitutional: She is oriented to person, place, and time and well-developed, well-nourished, and in no distress.  HENT:  Head: Normocephalic.  Mouth/Throat: Oropharynx is clear and moist.  Eyes: Conjunctivae and EOM are normal. Pupils are equal, round, and reactive to light. No scleral icterus.  Neck: Normal range of motion. Neck  supple. No JVD present. No tracheal deviation present.  Cardiovascular: Normal rate, regular rhythm and normal heart sounds. Exam reveals no gallop.  No murmur heard. Pulmonary/Chest: Effort normal. No respiratory distress. She has wheezes. She has no rales.  Crackles  Abdominal: Soft. Bowel sounds are normal. She exhibits no distension. There is no tenderness. There is no rebound.  Musculoskeletal: Normal range of motion. She exhibits no edema or tenderness.  Neurological: She is alert and oriented to person, place, and time. No cranial nerve deficit.  Skin: No rash noted. No erythema.  Psychiatric: Affect normal.   LABORATORY PANEL:  Female CBC Recent Labs  Lab 03/22/17 0416  WBC 11.0  HGB 9.2*  HCT 29.1*  PLT 305   ------------------------------------------------------------------------------------------------------------------ Chemistries  Recent Labs  Lab 03/22/17 0416  NA 134*  K 4.6  CL 104  CO2 19*  GLUCOSE 291*  BUN 72*  CREATININE 2.72*  CALCIUM 7.7*  MG 1.9   RADIOLOGY:  No results found. ASSESSMENT AND PLAN:   83 year old female with past medical history of COPD with ongoing tobacco abuse, chronic back pain, osteoporosis, hypertension, diabetes who presents to the hospital due to shortness of breath cough and wheezing and noted to be in acute respiratory failure with hypoxia.  1. Acute respiratory failure with hypoxia-secondary to COPD exacerbation. continue IV steroids, scheduled DuoNeb nebs, Pulmicort nebs and empiric antibiotics for community acquired pneumonia with ceftriaxone and Zithromax. Try to wean off oxygen. -Assess the patient for home oxygen prior to discharge.  2. COPD exacerbation-as the cause of patient's worsening respiratory failure. Continue treatment as mentioned above. -Assess the patient's home oxygen prior to discharge.  3. Pneumonia-suspected to be community  acquired pneumonia. We'll treat patient with IV ceftriaxone, Zithromax.  Follow cultures.  4. Acute kidney injury-secondary to dehydration. On IV fluids, follow BUN and creatinine. Hold Lasix for now.  5. New-onset atrial fibrillation-patient noted to be in A. Fib w. RVR.  - This could be related to the patient's respiratory illness as mentioned. Echocardiogram: Systolic function was normal. The estimated ejection fraction was in the range of 60% to 65%.   Continue heparin drip and Cardizem. At discharge she will be on anticoagulation per cardiologist.  6. Elevated troponin-suspected to be seconded to supply demand ischemia from new-onset A. fib and hypoxemia.  7. Essential hypertension-continue current exam.  8. Diabetes type 2 without complication-continue Lantus, sliding scale insulin, add novolog 5 units AC, on Actos.  9. GERD-continue Protonix.  10. Hypothyroidism-continue Synthroid.  11. Tobacco abuse.  Smoking cessation was counseled for 3-4 minutes.  Generalized weakness.  PT evaluation.  Discussed with cardiology PA Mr. Shea EvansDunn. All the records are reviewed and case discussed with Care Management/Social Worker. Management plans discussed with the patient, her daughter and sister and they are in agreement.  CODE STATUS: Full Code  TOTAL TIME TAKING CARE OF THIS PATIENT: 42 minutes.   More than 50% of the time was spent in counseling/coordination of care: YES  POSSIBLE D/C IN 3 DAYS, DEPENDING ON CLINICAL CONDITION.   Shaune PollackQing Abhishek Levesque M.D on 03/22/2017 at 3:21 PM  Between 7am to 6pm - Pager - [phone]  After 6pm go to [email or web address] - Therapist, nutritionalpassword EPAS [facility]  [facility] [place] [facility]

## 2017-03-22 NOTE — Care Management (Signed)
Patient was not able to speak with CM due to ongoing coughing.  Spoke with son Tasha Grant who relayed that his brother Tasha Grant "knows more about her than I do."  Spoke with kenneth.  Patient does not have health care power of attorney but family has been meaning to dot his.  CM instructed the process could be completed while patient is here. Patient's daughter Tasha Grant is caregiver during the ay and grandson spends the night. Patient has a hospital bed with trapeze.  She is non ambulatory since having a back infection  several years ago and lost the use of her legs in the nursing facility because her knees were bent all of the time.  She is able to to control/ steer  a small sized wheelchair with her feet. Discussed there may be a possible need for home oxygen. Tasha Grant is agreeable to have home health nurse.  Has had home health in the past and would like the same agency but does not know the name.  CM found the agency is [facility] and heads up referral placed

## 2017-03-22 NOTE — Progress Notes (Signed)
Pt still have some coughing episode PRN cough medicine give. At evening pt complain about burning mouth. Upon assessment pt tongue was red. MD to place order.

## 2017-03-22 NOTE — Progress Notes (Signed)
Progress Note  Patient Name: Tasha SprinklesMamie L Grant Date of Encounter: 03/22/2017  Primary [facility]ologist[facility] - consult by Chavis Tessler  Subjective   SOB about the same. Remains in Afib, rate controlled on PO diltiazem. Heparin gtt. Echo pending. Troponin 0.14 as of 11/28. No chest pain. Leukocytosis improved. HGB 10.8-->9.2 (likely dilutional). SCr 2.99-->2.72, BUN 69-->72, K+ 4.3-->4.6. No tsh or magnesium. BP currently stable.   Inpatient Medications    Scheduled Meds: . amLODipine  5 mg Oral Daily  . aspirin EC  81 mg Oral Daily  . budesonide (PULMICORT) nebulizer solution  0.5 mg Nebulization BID  . chlorhexidine  15 mL Mouth Rinse BID  . cholecalciferol  2,000 Units Oral QHS  . diltiazem  30 mg Oral Q6H  . docusate sodium  100 mg Oral BID  . fluticasone  2 spray Each Nare Daily  . gabapentin  300 mg Oral Daily  . guaiFENesin  600 mg Oral BID  . insulin aspart  0-5 Units Subcutaneous QHS  . insulin aspart  0-9 Units Subcutaneous TID WC  . insulin glargine  25 Units Subcutaneous Daily  . ipratropium-albuterol  3 mL Nebulization Q6H  . levothyroxine  75 mcg Oral QAC breakfast  . mouth rinse  15 mL Mouth Rinse q12n4p  . methylPREDNISolone (SOLU-MEDROL) injection  40 mg Intravenous Q6H  . mirtazapine  15 mg Oral QHS  . pantoprazole  40 mg Oral Daily  . pioglitazone  45 mg Oral Daily  . sodium chloride flush  3 mL Intravenous Q12H   Continuous Infusions: . sodium chloride 50 mL/hr at 03/21/17 1835  . azithromycin    . cefTRIAXone (ROCEPHIN)  IV    . heparin 900 Units/hr (03/21/17 2055)   PRN Meds: acetaminophen **OR** acetaminophen, guaiFENesin-dextromethorphan, ondansetron **OR** ondansetron (ZOFRAN) IV   Vital Signs    Vitals:   03/21/17 1704 03/21/17 2100 03/22/17 0435 03/22/17 0556  BP:   102/64   Pulse:   80 83  Resp:   17   Temp:   98 F (36.7 C)   TempSrc:      SpO2:  93% (!) 89% 92%  Weight: 142 lb 9.6 oz (64.7 kg)     Height: 5\' 5"  (1.651 m)        Intake/Output Summary (Last 24 hours) at 03/22/2017 0714 Last data filed at 03/22/2017 0400 Gross per 24 hour  Intake 2099.58 ml  Output 1 ml  Net 2098.58 ml   Filed Weights   03/21/17 1233 03/21/17 1704  Weight: 155 lb (70.3 kg) 142 lb 9.6 oz (64.7 kg)    Telemetry    Afib, 70s to 80s bpm - Personally Reviewed  ECG    n/a - Personally Reviewed  Physical Exam   GEN: Frail appearing; No acute distress.   Neck: No JVD. Cardiac: Irregularly irregular, no murmurs, rubs, or gallops.  Respiratory: Diminished breath sounds bilaterally with bilateral wheezing.  GI: Soft, nontender, non-distended.   MS: No edema, wound on LLE dressed; No deformity. Neuro:  Alert and oriented x 3; Nonfocal.  Psych: Normal affect.  Labs    Chemistry Recent Labs  Lab 03/21/17 1237 03/22/17 0416  NA 138 134*  K 4.3 4.6  CL 102 104  CO2 21* 19*  GLUCOSE 113* 291*  BUN 69* 72*  CREATININE 2.99* 2.72*  CALCIUM 8.4* 7.7*  GFRNONAA 13* 15*  GFRAA 16* 17*  ANIONGAP 15 11     Hematology Recent Labs  Lab 03/16/17 1807 03/21/17 1237  03/22/17 0416  WBC 12.4* 19.1* 11.0  RBC 4.25 4.23 3.62*  HGB 10.9* 10.8* 9.2*  HCT 34.6* 33.9* 29.1*  MCV 81.4 80.2 80.4  MCH 25.7* 25.4* 25.5*  MCHC 31.6* 31.7* 31.7*  RDW 18.8* 18.6* 18.5*  PLT 235 339 305    Cardiac Enzymes Recent Labs  Lab 03/16/17 1807 03/16/17 2043 03/21/17 1237  TROPONINI 0.08* 0.04* 0.14*   No results for input(s): TROPIPOC in the last 168 hours.   BNP Recent Labs  Lab 03/16/17 1807 03/21/17 1237  BNP 387.0* 2,116.0*     DDimer No results for input(s): DDIMER in the last 168 hours.   Radiology    Dg Chest 2 View  Result Date: 03/21/2017 IMPRESSION: 1. Increasing airspace filling opacities in the bases, consistent with bilateral lower lobe infiltrates, increased bilaterally since prior study. 2. Chronic changes of fibrotic lung disease. Electronically Signed   By: Norva PavlovElizabeth  Brown M.D.   On: 03/21/2017  14:35    Cardiac Studies   TTE pending  Patient Profile     83 y.o. female with history of COPD 2/2 ongoing tobacco abuse, CKD stage II, DM2, HTN, hypothyroidism, chronic back pain, and GERD who [facility] with acute respiratory distress 2/2 AECOPD.   Assessment & Plan    1. New onset Afib with RVR: -Remains in Afib with controlled ventricular rates -Will stop amlodipine as she is also on PO diltiazem -Titrate diltiazem as needed for rate control -Would avoid beta blocker at this time given she continues to wheeze -Heparin gtt -Given her elevated CHADS2VASc of 6 (HTN, age x 2, DM, vascular disease, female) she will need long term, full dose anticoagulation -Duration of her Afib is uncertain, though she was noted to be in sinus rhythm on 12-lead EKG in the ED on 11/23 -Coumadin vs DOAC prior to discharge pending trend of renal function -Given her heart rate is well controlled at this time, consider outpatient DCCV after she has been adequately anticoagulated -If rate becomes difficult to control while admitted she may need TEE/DCCV -Likely in the setting of her acute pulmonary illness -Check tsh and magnesium -Potassium at goal  2. Elevated troponin: -Chest pain free -Continue to cycle troponin until peak -Echo pending -Given her active wheezing and inability to lay supine, no plans for ischemic evaluation at this time -She is at increased risk for CAD given her comorbid conditions and she will require ischemic evaluation once her acute illness is improved -Heparin gtt  -At discharge she will be on anticoagulation   3. Acute respiratory distress with hypoxia: -2/2 AECOPD -Per IM  4. Acute on CKD stage II: -Improving with hydration -Continue to avoid nephrotoxic agents  For questions or updates, please contact [facility] Please consult [email or web address] for contact info under Cardiology/STEMI.    Signed, Eula Listenyan Dunn, PA-C [facility] Pager: [phone]  [phone] 03/22/2017, 7:14 AM   Attending Note Patient seen and examined, agree with detailed note above,  Patient presentation and plan discussed on rounds.    On my examination today, her gown was off, telemetry wires unattached, trying to drink coffee Significant coughing, wheezing Reported no significant change compared to yesterday Started on heparin infusion yesterday for atrial fibrillation  Echocardiogram results discussed with her in detail, ejection fraction 60-65%, moderate TR, mildly elevated right heart pressures 45 mm Hg  On physical exam no significant JVD, coarse breath sounds, wheezing, heart sounds irregularly irregular no murmurs appreciated, abdomen soft nontender, no significant lower extremity edema  Lab work  reviewed showing troponin 0 0.08, creatinine 2.72, BUN 72  A/P:  1. Acute respiratory distress Secondary to COPD exacerbation,  Elevated white count,  cough, hypoxia on broad-spectrum antibiotics, nebulizers, steroids  2.  Elevated troponin Demand ischemia in the setting of atrial fibrillation, hypotension from dehydration This is not a non-STEMI No ischemic workup planned at this time given   3.  New atrial fibrillation Elevated chads vasc , on heparin infusion Monitor renal function, uncertain if she will be a NOAC  candidate Rate control with diltiazem, can likely tolerate extended release 120 mg daily  4.  Acute on chronic renal failure Appears to have stage IIIb at baseline, creatinine 1.5 After Lasix, anorexia/poor fluid intake, now with dramatic rise in creatinine up to 3. Suspect component of ATN  5.  Diabetes Per medicine, sliding scale  6.  Active smoker Smoking cessation recommended  Greater than 50% was spent in counseling and coordination of care with patient Total encounter time 25 minutes or more   Signed: Dossie Arbour  M.D., Ph.D. [facility]

## 2017-03-22 NOTE — Progress Notes (Signed)
ANTICOAGULATION CONSULT NOTE - Initial Consult  Pharmacy Consult for Heparin Indication: atrial fibrillation  Allergies  Allergen Reactions  . Codeine Itching  . Morphine And Related Itching  . Quinapril Other (See Comments)  . Sulfa Antibiotics Nausea Only    Patient Measurements: Height: 5\' 5"  (165.1 cm) Weight: 142 lb 9.6 oz (64.7 kg) IBW/kg (Calculated) : 57 Heparin Dosing Weight: 64.7  Vital Signs: Temp: 97.8 F (36.6 C) (11/29 0741) Temp Source: Oral (11/29 0741) BP: 115/56 (11/29 1200) Pulse Rate: 82 (11/29 1200)  Labs: Recent Labs    03/21/17 1237 03/22/17 0416 03/22/17 1147  HGB 10.8* 9.2*  --   HCT 33.9* 29.1*  --   PLT 339 305  --   HEPARINUNFRC  --  0.64 0.47  CREATININE 2.99* 2.72*  --   TROPONINI 0.14* 0.08*  --     Estimated Creatinine Clearance: 13.9 mL/min (A) (by C-G formula based on SCr of 2.72 mg/dL (H)).   Medical History: Past Medical History:  Diagnosis Date  . Chronic low back pain without sciatica   . Diabetes mellitus without complication (HCC)   . Renal disorder     Medications:  Medications Prior to Admission  Medication Sig Dispense Refill Last Dose  . acetaminophen (TYLENOL) 500 MG tablet Take 1,000 mg by mouth every 8 (eight) hours as needed.   PRN at PRN  . alendronate (FOSAMAX) 10 MG tablet Take 1 tablet (10 mg total) by mouth once a week. (Patient taking differently: Take 10 mg by mouth daily. ) 4 tablet 0 03/21/2017 at AM  . amLODipine (NORVASC) 5 MG tablet Take 5 mg by mouth daily. for high blood pressure  3 03/21/2017 at AM  . aspirin EC 81 MG tablet Take 1 tablet by mouth daily.   03/21/2017 at AM  . cholecalciferol (VITAMIN D) 1000 units tablet Take 2,000 Units by mouth at bedtime.   03/20/2017 at 2000  . docusate sodium (COLACE) 100 MG capsule Take 100 mg by mouth 2 (two) times daily.   PRN at PRN  . doxycycline (VIBRAMYCIN) 100 MG capsule Take 1 capsule (100 mg total) by mouth 2 (two) times daily for 7 days. 14  capsule 0 03/21/2017 at AM  . fluticasone (FLONASE) 50 MCG/ACT nasal spray Place 2 sprays into both nostrils daily.   03/21/2017 at AM  . furosemide (LASIX) 20 MG tablet Take 1 tablet (20 mg total) by mouth daily for 7 days. 7 tablet 0 03/21/2017 at AM  . gabapentin (NEURONTIN) 300 MG capsule Take 1 capsule by mouth daily.  3 03/21/2017 at AM  . Insulin Glargine (LANTUS SOLOSTAR) 100 UNIT/ML Solostar Pen Inject 25 Units into the skin daily.   03/21/2017 at N/A  . JANUVIA 50 MG tablet Take 1 tablet by mouth daily.   03/21/2017 at AM  . levothyroxine (SYNTHROID, LEVOTHROID) 75 MCG tablet Take 1 tablet by mouth daily.   03/21/2017 at AM  . Menthol-Zinc Oxide (CALMOSEPTINE) 0.44-20.6 % OINT Apply 1 application topically as needed (WITH EVERY DIAPER CHANGE).   PRN at PRN  . mirtazapine (REMERON SOL-TAB) 15 MG disintegrating tablet Take 1 tablet by mouth at bedtime.  3 03/20/2017 at 2000  . omeprazole (PRILOSEC) 20 MG capsule Take 1 capsule by mouth 2 (two) times daily.  3 03/21/2017 at AM  . pioglitazone (ACTOS) 45 MG tablet Take 1 tablet by mouth daily.   03/21/2017 at AM  . VENTOLIN HFA 108 (90 BASE) MCG/ACT inhaler Inhale 2 puffs into the lungs every  4 (four) hours as needed.   PRN at PRN  . HYDROcodone-acetaminophen (NORCO/VICODIN) 5-325 MG tablet Take 1 tablet by mouth every 4 (four) hours as needed for moderate pain. (Patient not taking: Reported on 03/16/2017) 16 tablet 0 Not Taking at D/C  . insulin aspart (NOVOLOG) 100 UNIT/ML injection Check finger stick before meals.  For glucose of 121 to 150 give 1 unit.  151 to 200 give 2 units. 201-250 give 3 units.  251-300 give 5 units.  301-350 give 7 units; 351-400 give 9 units (Patient not taking: Reported on 03/16/2017) 10 mL 0 Not Taking at D/C  . nicotine (NICODERM CQ - DOSED IN MG/24 HOURS) 21 mg/24hr patch Place 1 patch (21 mg total) onto the skin daily. (Patient not taking: Reported on 03/16/2017) 28 patch 0 Not Taking at D/C  . nystatin cream  (MYCOSTATIN) Apply 1 g topically 4 (four) times daily as needed.  1 PRN at PRN  . predniSONE (DELTASONE) 50 MG tablet Take 1 tablet (50 mg total) by mouth daily with breakfast. (Patient not taking: Reported on 03/16/2017) 5 tablet 0 Not Taking at D/C   Scheduled:  . aspirin EC  81 mg Oral Daily  . azithromycin  250 mg Oral q1800  . budesonide (PULMICORT) nebulizer solution  0.5 mg Nebulization BID  . chlorhexidine  15 mL Mouth Rinse BID  . cholecalciferol  2,000 Units Oral QHS  . diltiazem  30 mg Oral Q6H  . docusate sodium  100 mg Oral BID  . fluticasone  2 spray Each Nare Daily  . gabapentin  300 mg Oral Daily  . guaiFENesin  600 mg Oral BID  . insulin aspart  0-5 Units Subcutaneous QHS  . insulin aspart  0-9 Units Subcutaneous TID WC  . insulin aspart  5 Units Subcutaneous TID WC  . insulin glargine  25 Units Subcutaneous Daily  . ipratropium-albuterol  3 mL Nebulization Q6H  . levothyroxine  75 mcg Oral QAC breakfast  . mouth rinse  15 mL Mouth Rinse q12n4p  . methylPREDNISolone (SOLU-MEDROL) injection  40 mg Intravenous Q6H  . mirtazapine  15 mg Oral QHS  . pantoprazole  40 mg Oral Daily  . pioglitazone  45 mg Oral Daily  . sodium chloride flush  3 mL Intravenous Q12H    Assessment: Pharmacy consulted to dose and monitor Heparin in this 83 year old female with atrial fibrillation . Goal of Therapy:  Heparin level 0.3-0.7 Monitor platelets by anticoagulation protocol: Yes   Plan:  HL= 0.47. Continue current rate. This is the second therapeutic level. Will check daily starting tomorrow AM.  Olene FlossMelissa D Nolton Denis, Pharm.D, BCPS Clinical Pharmacist 03/22/2017

## 2017-03-22 NOTE — Progress Notes (Signed)
Initial Nutrition Assessment  DOCUMENTATION CODES:   Obesity unspecified, Not applicable  INTERVENTION:   Nepro Shake po BID, each supplement provides 425 kcal and 19 grams protein  MVI  Vitamin C 5100m po daily   Liberalize diet  Magic cup TID with meals, each supplement provides 290 kcal and 9 grams of protein  NUTRITION DIAGNOSIS:   Inadequate oral intake related to acute illness as evidenced by per patient/family report.  GOAL:   Patient will meet greater than or equal to 90% of their needs  MONITOR:   PO intake, Supplement acceptance, Labs, Weight trends, I & O's  REASON FOR ASSESSMENT:   Malnutrition Screening Tool    ASSESSMENT:   83year old female with past medical history of COPD with ongoing tobacco abuse, chronic back pain, osteoporosis, hypertension, diabetes who presents to the hospital due to shortness of breath cough and wheezing and noted to be in acute respiratory failure with hypoxia.   Met with pt and pt's daughter in room today. Pt is a poor historian so history obtained from pt's daughter who reports that pt is a poor eater at baseline. Per daughter, pt prefers to eat multiple small meals vs three big meals and usually prefers food such as nabs and ice cream. Pt does not eat a lot of fruits and vegetables. Per chart, pt is weight stable. Pt currently eating <25% of meals. RD will order supplements; pt requesting supplement low in potassium. RD will order Nepro. RD will order vitamin C and MVI; suspect pt with low vitamin C r/t poor intake of fruits and vegetables and the increased needs from smoking. Spoke with daughter about proper nutrition and heart disease.   Medications reviewed and include: aspirin, azithromycin, Vitamin D, colace, insulin, synthroid, solu-medrol, mirtazapine, nicotine, protonix, actos, NaCl _0 /hr, ceftriaxone, heparin  Labs reviewed: Na 134(L), BUN 72(H), creat 2.72(H), Ca 7.7(L), Mg 1.9 wnl BNP- 2116(H)- 11/28 Hgb 9.2(L),  Hct 29.1(L) cbgs- 113, 291 x 24 hrs  Nutrition-Focused physical exam completed. Findings are no fat depletion, moderate to severe muscle depletion in BLE, and no edema.   Diet Order:  Diet heart healthy/carb modified Room service appropriate? Yes; Fluid consistency: Thin  EDUCATION NEEDS:   Education needs have been addressed(with family member)  Skin:  Reviewed RN Assessment  Last BM:  11/28  Height:   Ht Readings from Last 1 Encounters:  03/21/17 _1  (1.651 m)    Weight:   Wt Readings from Last 1 Encounters:  03/21/17 142 lb 9.6 oz (64.7 kg)    Ideal Body Weight:  56.8 kg  BMI:  Body mass index is 23.73 kg/m.  Estimated Nutritional Needs:   Kcal:  1500-1700kcal/day   Protein:  65-77g/day   Fluid:  per MD  CKoleen DistanceMS, RD, LDN Pager #[phone] Hours Pager: [phone]

## 2017-03-22 NOTE — Care Management (Signed)
CM informed by [facility] that patient is currently open to [facility]. Confirmed with [facility].

## 2017-03-22 NOTE — Progress Notes (Signed)
PHARMACIST - PHYSICIAN COMMUNICATION DR:   Imogene Burnchen CONCERNING: Antibiotic IV to Oral Route Change Policy  RECOMMENDATION: This patient is receiving azithromycin by the intravenous route.  Based on criteria approved by the Pharmacy and Therapeutics Committee, the antibiotic(s) is/are being converted to the equivalent oral dose form(s).   DESCRIPTION: These criteria include:  Patient being treated for a respiratory tract infection, urinary tract infection, cellulitis or clostridium difficile associated diarrhea if on metronidazole  The patient is not neutropenic and does not exhibit a GI malabsorption state  The patient is eating (either orally or via tube) and/or has been taking other orally administered medications for a least 24 hours  The patient is improving clinically and has a Tmax < 100.5  If you have questions about this conversion, please contact the Pharmacy Department  []   [phone]( [phone] )  [facility] [x]   [phone]( [phone] )  [facility] []   [phone]( [phone] )  [facility] []   [phone]( [phone] )  [facility] []   [phone]( [phone] )  [facility]

## 2017-03-22 NOTE — Progress Notes (Signed)
Inpatient Diabetes Program Recommendations  AACE/ADA: New Consensus Statement on Inpatient Glycemic Control (2015)  Target Ranges:  Prepandial:   less than 140 mg/dL      Peak postprandial:   less than 180 mg/dL (1-2 hours)      Critically ill patients:  140 - 180 mg/dL   Lab Results  Component Value Date   GLUCAP 266 (H) 03/22/2017   HGBA1C 13.6 (H) 05/26/2013    Review of Glycemic Control  Results for [name], Tasha L (MRN [id number]) as of 03/22/2017 09:35  Ref. Range 03/21/2017 17:23 03/21/2017 20:38 03/22/2017 08:13  Glucose-Capillary Latest Ref Range: 65 - 99 mg/dL 914209 (H) 782343 (H) 956266 (H)    Diabetes history: Type 2 Outpatient Diabetes medications: Lantus 25 units qday, Januvia 50mg  qday, Actos 45mg  qday  Current orders for Inpatient glycemic control: Novolog 5 units tid, Novolog 0-9 units tid, Novolog 0-5 units qhs, 45 mg Actos, Lantus 25 units qday *Solumedrol 40mg  q6h  Inpatient Diabetes Program Recommendations: Agree with current medications for blood sugar management.   We will continue to follow through her stay.  Susette RacerJulie Alitzel Cookson, RN, BA, MHA, CDE Diabetes Coordinator Inpatient Diabetes Program  [phone] (Team Pager) [phone] [facility] Office) 03/22/2017 9:43 AM

## 2017-03-22 NOTE — Clinical Social Work Note (Signed)
CSW received consult that patient may need SNF placement for short term rehab.  CSW awaiting for PT recommendations.  Ervin KnackEric R. Hassan Rowannterhaus, MSW, Theresia MajorsLCSWA [phone]  03/22/2017 3:37 PM

## 2017-03-23 DIAGNOSIS — I480 Paroxysmal atrial fibrillation: Secondary | ICD-10-CM

## 2017-03-23 LAB — BASIC METABOLIC PANEL
ANION GAP: 12 (ref 5–15)
BUN: 76 mg/dL — ABNORMAL HIGH (ref 6–20)
CHLORIDE: 103 mmol/L (ref 101–111)
CO2: 19 mmol/L — AB (ref 22–32)
Calcium: 7.9 mg/dL — ABNORMAL LOW (ref 8.9–10.3)
Creatinine, Ser: 2.41 mg/dL — ABNORMAL HIGH (ref 0.44–1.00)
GFR calc non Af Amer: 17 mL/min — ABNORMAL LOW (ref >60)
GFR, EST AFRICAN AMERICAN: 20 mL/min — AB (ref >60)
Glucose, Bld: 116 mg/dL — ABNORMAL HIGH (ref 65–99)
Potassium: 4.1 mmol/L (ref 3.5–5.1)
Sodium: 134 mmol/L — ABNORMAL LOW (ref 135–145)

## 2017-03-23 LAB — GLUCOSE, CAPILLARY
GLUCOSE-CAPILLARY: 163 mg/dL — AB (ref 65–99)
GLUCOSE-CAPILLARY: 196 mg/dL — AB (ref 65–99)
GLUCOSE-CAPILLARY: 140 mg/dL — AB (ref 65–99)
Glucose-Capillary: 93 mg/dL (ref 65–99)
Glucose-Capillary: 148 mg/dL — ABNORMAL HIGH (ref 65–99)

## 2017-03-23 LAB — CBC
HEMATOCRIT: 28.9 % — AB (ref 35.0–47.0)
HEMOGLOBIN: 9 g/dL — AB (ref 12.0–16.0)
MCH: 25.4 pg — ABNORMAL LOW (ref 26.0–34.0)
MCHC: 31.3 g/dL — ABNORMAL LOW (ref 32.0–36.0)
MCV: 81.1 fL (ref 80.0–100.0)
Platelets: 309 10*3/uL (ref 150–440)
RBC: 3.56 MIL/uL — ABNORMAL LOW (ref 3.80–5.20)
RDW: 18.9 % — ABNORMAL HIGH (ref 11.5–14.5)
WBC: 14.6 10*3/uL — ABNORMAL HIGH (ref 3.6–11.0)

## 2017-03-23 LAB — HEPARIN LEVEL (UNFRACTIONATED): HEPARIN UNFRACTIONATED: 0.99 [IU]/mL — AB (ref 0.30–0.70)

## 2017-03-23 MED ORDER — FUROSEMIDE 10 MG/ML IJ SOLN
20.0000 mg | Freq: Once | INTRAMUSCULAR | Status: AC
  Administered 2017-03-23: 20 mg via INTRAVENOUS
  Filled 2017-03-23: qty 2

## 2017-03-23 MED ORDER — MAGIC MOUTHWASH W/LIDOCAINE
2.0000 mL | Freq: Three times a day (TID) | ORAL | Status: DC
  Filled 2017-03-23: qty 5

## 2017-03-23 MED ORDER — DILTIAZEM HCL ER COATED BEADS 120 MG PO CP24
120.0000 mg | ORAL_CAPSULE | Freq: Every day | ORAL | Status: DC
  Administered 2017-03-23 – 2017-03-27 (×5): 120 mg via ORAL
  Filled 2017-03-23 (×6): qty 1

## 2017-03-23 MED ORDER — LIDOCAINE VISCOUS 2 % MT SOLN
2.0000 mL | Freq: Three times a day (TID) | OROMUCOSAL | Status: DC
  Administered 2017-03-23 – 2017-03-27 (×8): 2 mL via OROMUCOSAL
  Filled 2017-03-23 (×2): qty 15
  Filled 2017-03-23: qty 5
  Filled 2017-03-23 (×5): qty 15

## 2017-03-23 MED ORDER — APIXABAN 2.5 MG PO TABS
2.5000 mg | ORAL_TABLET | Freq: Two times a day (BID) | ORAL | Status: DC
  Administered 2017-03-23 – 2017-03-28 (×11): 2.5 mg via ORAL
  Filled 2017-03-23 (×11): qty 1

## 2017-03-23 MED ORDER — IPRATROPIUM-ALBUTEROL 0.5-2.5 (3) MG/3ML IN SOLN: 3.0000 mL | Freq: Four times a day (QID) | RESPIRATORY_TRACT | Status: DC

## 2017-03-23 MED ORDER — MAGIC MOUTHWASH
2.0000 mL | Freq: Three times a day (TID) | ORAL | Status: DC
  Administered 2017-03-23 – 2017-03-26 (×8): 2 mL via ORAL
  Filled 2017-03-23 (×5): qty 10
  Filled 2017-03-23: qty 5
  Filled 2017-03-23 (×11): qty 10

## 2017-03-23 MED ORDER — ALPRAZOLAM 0.25 MG PO TABS
0.2500 mg | ORAL_TABLET | Freq: Three times a day (TID) | ORAL | Status: DC | PRN
  Administered 2017-03-23 – 2017-03-26 (×5): 0.25 mg via ORAL
  Filled 2017-03-23 (×5): qty 1

## 2017-03-23 NOTE — Progress Notes (Signed)
[employer] [facility] [place] [facility]   PATIENT NAME: Tasha LarocheMamie Grant    MR#:  [id number]  DATE OF BIRTH:  29-Jun-1930  SUBJECTIVE:  CHIEF COMPLAINT:   Chief Complaint  Patient presents with  . Shortness of Breath   Worsening shortness of breath and cough, on O2 [place] 3 L.  She has hypoxia with 2 L. REVIEW OF SYSTEMS:  Review of Systems  Constitutional: Positive for malaise/fatigue. Negative for chills and fever.  HENT: Negative for sore throat.   Eyes: Negative for blurred vision and double vision.  Respiratory: Positive for cough and shortness of breath. Negative for hemoptysis, wheezing and stridor.   Cardiovascular: Negative for chest pain, palpitations, orthopnea and leg swelling.  Gastrointestinal: Negative for abdominal pain, blood in stool, diarrhea, melena, nausea and vomiting.  Genitourinary: Negative for dysuria, flank pain and hematuria.  Musculoskeletal: Negative for back pain and joint pain.  Neurological: Positive for weakness. Negative for dizziness, sensory change, focal weakness, seizures, loss of consciousness and headaches.  Endo/Heme/Allergies: Negative for polydipsia.  Psychiatric/Behavioral: Negative for depression. The patient is not nervous/anxious.     DRUG ALLERGIES:   Allergies  Allergen Reactions  . Codeine Itching  . Morphine And Related Itching  . Quinapril Other (See Comments)  . Sulfa Antibiotics Nausea Only   VITALS:  Blood pressure 136/61, pulse 79, temperature 97.7 F (36.5 C), resp. rate (!) 22, height 5\' 5"  (1.651 m), weight 153 lb 1.6 oz (69.4 kg), SpO2 91 %. PHYSICAL EXAMINATION:  Physical Exam  Constitutional: She is oriented to person, place, and time and well-developed, well-nourished, and in no distress.  HENT:  Head: Normocephalic.  Mouth/Throat: Oropharynx is clear and moist.  Eyes: Conjunctivae and EOM are normal. Pupils are equal, round, and reactive to light. No scleral icterus.  Neck: Normal range of motion.  Neck supple. No JVD present. No tracheal deviation present.  Cardiovascular: Normal rate, regular rhythm and normal heart sounds. Exam reveals no gallop.  No murmur heard. Pulmonary/Chest: Effort normal. No respiratory distress. She has wheezes. She has no rales.  Crackles and rhonchi.  Abdominal: Soft. Bowel sounds are normal. She exhibits no distension. There is no tenderness. There is no rebound.  Musculoskeletal: Normal range of motion. She exhibits no edema or tenderness.  Neurological: She is alert and oriented to person, place, and time. No cranial nerve deficit.  Skin: No rash noted. No erythema.  Psychiatric: Affect normal.   LABORATORY PANEL:  Female CBC Recent Labs  Lab 03/23/17 0449  WBC 14.6*  HGB 9.0*  HCT 28.9*  PLT 309   ------------------------------------------------------------------------------------------------------------------ Chemistries  Recent Labs  Lab 03/22/17 0416 03/23/17 0449  NA 134* 134*  K 4.6 4.1  CL 104 103  CO2 19* 19*  GLUCOSE 291* 116*  BUN 72* 76*  CREATININE 2.72* 2.41*  CALCIUM 7.7* 7.9*  MG 1.9  --    RADIOLOGY:  No results found. ASSESSMENT AND PLAN:   83 year old female with past medical history of COPD with ongoing tobacco abuse, chronic back pain, osteoporosis, hypertension, diabetes who presents to the hospital due to shortness of breath cough and wheezing and noted to be in acute respiratory failure with hypoxia.  1. Acute respiratory failure with hypoxia-secondary to COPD exacerbation. continue IV steroids, scheduled DuoNeb nebs, Pulmicort nebs and empiric antibiotics for community acquired pneumonia with ceftriaxone and Zithromax. Try to wean off oxygen. -Assess the patient for home oxygen prior to discharge.  2. COPD exacerbation-as the cause of patient's worsening respiratory  failure. Continue treatment as mentioned above. -Assess the patient's home oxygen prior to discharge.  3. Pneumonia-suspected to be  community acquired pneumonia.  continue IV ceftriaxone, Zithromax. Follow cultures.  4. Acute kidney injury-secondary to dehydration. hold IV fluids due to worsening shortness of breath and wheezing, follow BUN and creatinine. Given one dose iv lasix 20 mg.   5. New-onset atrial fibrillation-patient noted to be in A. Fib w. RVR.  - This could be related to the patient's respiratory illness as mentioned. Echocardiogram: Systolic function was normal. The estimated ejection fraction was in the range of 60% to 65%.   Continue heparin drip and Cardizem. At discharge she will be on anticoagulation per cardiologist.  6. Elevated troponin-suspected to be seconded to supply demand ischemia from new-onset A. fib and hypoxemia.  7. Essential hypertension-continue current HTN meds.  8. Diabetes type 2 without complication-continue Lantus, sliding scale insulin, added novolog 5 units AC, on Actos.  9. GERD-continue Protonix.  10. Hypothyroidism-continue Synthroid.  11. Tobacco abuse.  Smoking cessation was counseled for 3-4 minutes.  Generalized weakness.  PT evaluation.  Discussed with cardiology PA Mr. Shea EvansDunn. All the records are reviewed and case discussed with Care Management/Social Worker. Management plans discussed with the patient, her daughter and sister and they are in agreement.  CODE STATUS: Full Code  TOTAL TIME TAKING CARE OF THIS PATIENT: 42 minutes.   More than 50% of the time was spent in counseling/coordination of care: YES  POSSIBLE D/C IN 3 DAYS, DEPENDING ON CLINICAL CONDITION.   Shaune PollackQing Ronzell Laban M.D on 03/23/2017 at 2:59 PM  Between 7am to 6pm - Pager - [phone]  After 6pm go to [email or web address] - Therapist, nutritionalpassword EPAS [facility]  [employer] [place] [facility]

## 2017-03-23 NOTE — Clinical Social Work Note (Signed)
CSW received referral for SNF.  Case discussed with case manager and plan is to discharge home with home health.  CSW to sign off please re-consult if social work needs arise.  Lakin Romer R. Nkosi Cortright, MSW, LCSWA [phone]  

## 2017-03-23 NOTE — Progress Notes (Signed)
Progress Note  Patient Name: Tasha SprinklesMamie L Grant Date of Encounter: 03/23/2017  Primary Cardiologist: Concha Se. Gollan, MD   Subjective   "I feel normal!" She feels like breathing back to baseline.  Still coughing frequently, non-productive.  No chest pain or palpitations.  Remains in Afib  rate controlled.  Inpatient Medications    Scheduled Meds: . aspirin EC  81 mg Oral Daily  . azithromycin  250 mg Oral q1800  . budesonide (PULMICORT) nebulizer solution  0.5 mg Nebulization BID  . chlorhexidine  15 mL Mouth Rinse BID  . cholecalciferol  2,000 Units Oral QHS  . diltiazem  30 mg Oral Q6H  . docusate sodium  100 mg Oral BID  . feeding supplement (NEPRO CARB STEADY)  237 mL Oral BID BM  . fluticasone  2 spray Each Nare Daily  . gabapentin  300 mg Oral Daily  . guaiFENesin  600 mg Oral BID  . insulin aspart  0-5 Units Subcutaneous QHS  . insulin aspart  0-9 Units Subcutaneous TID WC  . insulin aspart  5 Units Subcutaneous TID WC  . insulin glargine  25 Units Subcutaneous Daily  . ipratropium-albuterol  3 mL Nebulization Q6H  . levothyroxine  75 mcg Oral QAC breakfast  . mouth rinse  15 mL Mouth Rinse q12n4p  . methylPREDNISolone (SOLU-MEDROL) injection  40 mg Intravenous Q6H  . mirtazapine  15 mg Oral QHS  . multivitamin with minerals  1 tablet Oral Daily  . nicotine  14 mg Transdermal Daily  . pantoprazole  40 mg Oral Daily  . pioglitazone  45 mg Oral Daily  . sodium chloride flush  3 mL Intravenous Q12H  . vitamin C  500 mg Oral Daily   Continuous Infusions: . sodium chloride 50 mL/hr at 03/23/17 0858  . cefTRIAXone (ROCEPHIN)  IV Stopped (03/22/17 0947)  . heparin 750 Units/hr (03/23/17 16100621)   PRN Meds: acetaminophen **OR** acetaminophen, guaiFENesin-dextromethorphan, ondansetron **OR** ondansetron (ZOFRAN) IV   Vital Signs    Vitals:   03/22/17 2024 03/23/17 0303 03/23/17 0519 03/23/17 0749  BP:   (!) 109/54 136/61  Pulse:   74 79  Resp:   (!) 22   Temp:    97.7 F  (36.5 C)  TempSrc:      SpO2: 90% 90% 92% 90%  Weight:   153 lb 1.6 oz (69.4 kg)   Height:        Intake/Output Summary (Last 24 hours) at 03/23/2017 0958 Last data filed at 03/23/2017 0949 Gross per 24 hour  Intake 1651 ml  Output 250 ml  Net 1401 ml   Filed Weights   03/21/17 1233 03/21/17 1704 03/23/17 0519  Weight: 155 lb (70.3 kg) 142 lb 9.6 oz (64.7 kg) 153 lb 1.6 oz (69.4 kg)    Physical Exam   GEN: Well nourished, well developed, in no acute distress.  HEENT: Grossly normal.  Neck: Supple, no JVD, carotid bruits, or masses. Cardiac: IR, IR, distant, no murmurs, rubs, or gallops. No clubbing, cyanosis, edema.  Radials/DP/PT 1+ and equal bilaterally.  Respiratory:  Respirations regular and unlabored, coarse breath sounds throughout - coughing fit with rolling over/sitting up. GI: Soft, nontender, nondistended, BS + x 4. MS: legs contracted chronically. Skin: warm and dry, no rash. Neuro:  Strength and sensation are intact. Psych: AAOx3.  Normal affect.  Labs    Chemistry Recent Labs  Lab 03/21/17 1237 03/22/17 0416 03/23/17 0449  NA 138 134* 134*  K 4.3 4.6 4.1  CL  102 104 103  CO2 21* 19* 19*  GLUCOSE 113* 291* 116*  BUN 69* 72* 76*  CREATININE 2.99* 2.72* 2.41*  CALCIUM 8.4* 7.7* 7.9*  GFRNONAA 13* 15* 17*  GFRAA 16* 17* 20*  ANIONGAP 15 11 12      Hematology Recent Labs  Lab 03/21/17 1237 03/22/17 0416 03/23/17 0449  WBC 19.1* 11.0 14.6*  RBC 4.23 3.62* 3.56*  HGB 10.8* 9.2* 9.0*  HCT 33.9* 29.1* 28.9*  MCV 80.2 80.4 81.1  MCH 25.4* 25.5* 25.4*  MCHC 31.7* 31.7* 31.3*  RDW 18.6* 18.5* 18.9*  PLT 339 305 309    Cardiac Enzymes Recent Labs  Lab 03/16/17 1807 03/16/17 2043 03/21/17 1237 03/22/17 0416  TROPONINI 0.08* 0.04* 0.14* 0.08*      BNP Recent Labs  Lab 03/16/17 1807 03/21/17 1237  BNP 387.0* 2,116.0*     Radiology    Dg Chest 2 View  Result Date: 03/21/2017 CLINICAL DATA:  Pt went to the dr today and they  couldn't get an O2 reading. She also has a cough. Hx of diabetes. Current everyday smoker EXAM: CHEST  2 VIEW COMPARISON:  03/16/2017 FINDINGS: Heart size is normal. There are coarse interstitial markings, stable in appearance. There is progressive opacity at the left lung base, suspicious for acute infectious infiltrate. Early right lower lobe infiltrate is suspected. IMPRESSION: 1. Increasing airspace filling opacities in the bases, consistent with bilateral lower lobe infiltrates, increased bilaterally since prior study. 2. Chronic changes of fibrotic lung disease. Electronically Signed   By: Norva PavlovElizabeth  Brown M.D.   On: 03/21/2017 14:35    Telemetry    Afib, 70's to 80's - Personally Reviewed  Cardiac Studies   11.28.2018 2D Echocardiogram  Study Conclusions  - Left ventricle: The cavity size was normal. Systolic function was   normal. The estimated ejection fraction was in the range of 60%   to 65%. Wall motion was normal; there were no regional wall   motion abnormalities. Doppler parameters are consistent with   abnormal left ventricular relaxation (grade 1 diastolic   dysfunction). - Mitral valve: There was mild regurgitation. - Left atrium: The atrium was mildly dilated. - Right ventricle: Systolic function was normal. - Right atrium: The atrium was mildly dilated. - Tricuspid valve: There was moderate regurgitation. - Pulmonary arteries: Systolic pressure was mildly elevated. PA   peak pressure: 45 mm Hg (S).   Patient Profile     83 y.o. female with history of COPD 2/2 ongoing tobacco abuse, CKD stage II, DM2, HTN, hypothyroidism, chronic back pain, and GERD who [facility] with [facility] respiratory distress 2/2 AECOPD and was found to be in rapid Afib. Echo w/ nl EF.  Assessment & Plan    1.  Afib RVR:  In the setting of AECOPD:  Rated controlled on PO dilt  switch to long acting today @ 120 mg daily.  Currently on heparin.  Creat clearance 19. Will place on low dose  eliquis for now (savaysa, pradaxa, and warfarin are also options, though use of any of the DOACs is undefined once CrCl drops below 15).  Pt does have a h/o falls in the setting of chronic contractures of the legs  wheelchair bound.  Family member says that she has fallen 3x in the past six months in the setting of transferring to/from wheelchair.  No significant trauma.  In this setting however, we may wish to limit her exposure to oral anticoagulation in the future.  If she does not convert  on her own, we could plan outpt f/u and then elective DCCV after three wks of OAC, perhaps with a plan to stop OAC after four more wks.  2.  Elevated troponin:  Mild troponin elevation in the setting of AFib and AECOPD.  Non-specific trend 0.08  0.04  0.14  0.08. This does not represent ACS.  No chest pain.  Echo w/ nl EF and w/o WMAs.  Will consider outpt stress testing.  Pt not currently interested.  3.  Acute resp failure w/ hypoxia/AECOPD:  Pt says she is improving.  Still with coarse breath sounds and cough.  Inhalers/nebs/steroids/ab per IM.  4.  Type II DM: per IM.  5.  Tob Abuse: She says that she plans to continue smoking - "I'm 70 and still living." (she's 86).  Complete cessation advised.  6.  CKD IV: creat improved this AM.  7.  Hypothyroidism:  TSH wnl.  Signed, Nicolasa Ducking, NP  03/23/2017, 9:58 AM    For questions or updates, please contact   Please consult [email or web address] for contact info under Cardiology/STEMI.

## 2017-03-23 NOTE — Care Management Important Message (Signed)
Important Message  Patient Details  Name: Tasha SprinklesMamie L Holliman MRN: [id number] Date of Birth: December 29, 1930   Medicare Important Message Given:  Yes Signed IM notice given    Eber HongGreene, Kastin Cerda R, RN 03/23/2017, 9:39 AM

## 2017-03-23 NOTE — Progress Notes (Signed)
93% on 3l after neb treatment

## 2017-03-23 NOTE — Progress Notes (Signed)
Left patient on 3l [place] after neb, sats 93% at this time.

## 2017-03-23 NOTE — Progress Notes (Signed)
SATURATION QUALIFICATIONS: (This note is used to comply with regulatory documentation for home oxygen)  Patient Saturations on Room Air at Rest = 83%  Patient Saturations on Room Air while Ambulating = n/a%  Patient Saturations on 3 Liters of oxygen while resting = 91%  Please briefly explain why patient needs home oxygen:

## 2017-03-23 NOTE — Evaluation (Signed)
Physical Therapy Evaluation Patient Details Name: Tasha SprinklesMamie L Grant MRN: [id number] DOB: [birth year] Today's Date: 03/23/2017   History of Present Illness  Pt is an 83 y.o. female presenting to hospital with concerns for low BP and hypoxia; also noted with SOB.  Pt with recent ED visit and diagnosed with pulmonary edema and community acquired PNA.  Pt admitted to hospital with acute respiratory failure with hypoxia secondary to COPD exacerbation, PNA, AKI, and new onset a-fib with RVR.  PMH includes s/p T11 kypho; chronic comrpession deformities T11, L2, and L4; chronic LBP with sciatica; DM; h/o joint replacement; COPD; CKD.  Clinical Impression  Prior to hospital admission, pt was w/c bound and required assist for bed mobility and total assist for transfers (pt's daughter reports pt's LE's contracted and pt unable to bear weight through LE's) and uses bedpan in bed for toileting.  Per chart review, PT recommended hoyer lift October 2016 hospitalization but pt's daughter reports that pt has not required use of lift at home yet.  Pt lives alone but has caregiver assist from her daughter (during the day) and grandson (evenings/night); receiving HHPT.  Room set up to attempt sitting edge of bed but prior to being able to mobilize pt, pt's O2 sats noted to be 84-85% on 3 L O2 via nasal cannula resting in bed.  Respiratory therapist came in and assisted with boosting pt up in bed to improve positioning in bed (requiring logrolling in bed first to place sheets under pt to allow for boosting up in bed).  Pt's O2 sats improved to 87-88% on 3 L and pt given breathing treatment by respiratory and O2 sats improved to 93-94% on breathing treatment.  Nursing notified of above.  Pt noted to be very SOB with minimal activity.  Pt would benefit from skilled PT to address noted impairments and functional limitations (see below for any additional details).  Pt's daughter reports currently receiving HHPT.  Upon hospital  discharge, recommend pt discharge to home with 24/7 assist and trial HHPT as appropriate.    Follow Up Recommendations (trial HHPT)    Equipment Recommendations  (Hoyer lift; gait belt; slide board)    Recommendations for Other Services       Precautions / Restrictions Precautions Precautions: Fall Restrictions Weight Bearing Restrictions: No      Mobility  Bed Mobility Overal bed mobility: Needs Assistance Bed Mobility: Rolling Rolling: Mod assist         General bed mobility comments: assist to logroll L and R in bed to reposition linens under pt so that staff was able to boost pt up in bed to improve breathing/O2 sats  Transfers                 General transfer comment: Deferred d/t pt's SOB and O2 sats; pt also dependent at baseline  Ambulation/Gait             General Gait Details: Pt non-ambulatory x5-6 years.  Stairs            Wheelchair Mobility    Modified Rankin (Stroke Patients Only)       Balance                                             Pertinent Vitals/Pain Pain Assessment: Faces Faces Pain Scale: Hurts a little bit Pain Location: back Pain Descriptors / Indicators:  Sore Pain Intervention(s): Limited activity within patient's tolerance;Monitored during session;Repositioned  HR WFL during session.    Home Living Family/patient expects to be discharged to:: Private residence Living Arrangements: Alone Available Help at Discharge: Family;Available 24 hours/day Type of Home: House Home Access: Ramped entrance     Home Layout: One level Home Equipment: Hospital bed;Bedside commode;Other (comment)(Transport chair; hospital bed with trapeze; bed pan)      Prior Function Level of Independence: Needs assistance   Gait / Transfers Assistance Needed: Pt is w/c bound x5-6 years and LE's contracted.  Requires total assistance to perform transfer to/from w/c or car.  Navigates small w/c with feet.  ADL's  / Homemaking Assistance Needed: Requires family assistance.  Comments: Pt's daughter is pt's caregiver during the day and pt's grandson assists pt in evenings and at night (spends the night).     Hand Dominance        Extremity/Trunk Assessment   Upper Extremity Assessment Upper Extremity Assessment: Generalized weakness    Lower Extremity Assessment Lower Extremity Assessment: Generalized weakness(LE's contracted)       Communication   Communication: No difficulties  Cognition Arousal/Alertness: Awake/alert Behavior During Therapy: WFL for tasks assessed/performed Overall Cognitive Status: (Oriented to self)                                        General Comments General comments (skin integrity, edema, etc.): Pt resting in bed upon PT entry; pt's daughter present during session.  Nursing cleared pt for participation in physical therapy.  Pt and pt's daughter agreeable to PT session.    Exercises     Assessment/Plan    PT Assessment Patient needs continued PT services  PT Problem List Decreased strength;Decreased range of motion;Decreased activity tolerance;Decreased mobility;Cardiopulmonary status limiting activity       PT Treatment Interventions DME instruction;Functional mobility training;Therapeutic activities;Therapeutic exercise;Balance training;Patient/family education    PT Goals (Current goals can be found in the Care Plan section)  Acute Rehab PT Goals Patient Stated Goal: to go home PT Goal Formulation: With patient/family Time For Goal Achievement: 04/06/17 Potential to Achieve Goals: Good    Frequency Min 2X/week   Barriers to discharge        Co-evaluation               AM-PAC PT "6 Clicks" Daily Activity  Outcome Measure Difficulty turning over in bed (including adjusting bedclothes, sheets and blankets)?: Unable Difficulty moving from lying on back to sitting on the side of the bed? : Unable Difficulty sitting down  on and standing up from a chair with arms (e.g., wheelchair, bedside commode, etc,.)?: Unable Help needed moving to and from a bed to chair (including a wheelchair)?: Total Help needed walking in hospital room?: Total Help needed climbing 3-5 steps with a railing? : Total 6 Click Score: 6    End of Session   Activity Tolerance: Other (comment)(Limited d/t low O2 saturations) Patient left: in bed;with call bell/phone within reach;with bed alarm set;with family/visitor present(respiratory therapist in room) Nurse Communication: Mobility status;Precautions;Other (comment)(O2 sats) PT Visit Diagnosis: Other abnormalities of gait and mobility (R26.89);Muscle weakness (generalized) (M62.81)    Time: 1320-1340 PT Time Calculation (min) (ACUTE ONLY): 20 min   Charges:   PT Evaluation $PT Eval Low Complexity: 1 Low     PT G Codes:   PT G-Codes **NOT FOR INPATIENT CLASS** Functional Assessment Tool Used:  AM-PAC 6 Clicks Basic Mobility Functional Limitation: Mobility: Walking and moving around Mobility: Walking and Moving Around Current Status [phone]): 100 percent impaired, limited or restricted Mobility: Walking and Moving Around Goal Status [phone]): 100 percent impaired, limited or restricted    Hendricks Limes, PT 03/23/17, 3:39 PM [phone]

## 2017-03-23 NOTE — Progress Notes (Signed)
Inpatient Diabetes Program Recommendations  AACE/ADA: New Consensus Statement on Inpatient Glycemic Control (2015)  Target Ranges:  Prepandial:   less than 140 mg/dL      Peak postprandial:   less than 180 mg/dL (1-2 hours)      Critically ill patients:  140 - 180 mg/dL   Lab Results  Component Value Date   GLUCAP 163 (H) 03/23/2017   HGBA1C 13.6 (H) 05/26/2013    Review of Glycemic Control  Results for Tasha SprinklesHODGE, Tarrin L (MRN [id number]) as of 03/23/2017 10:16  Ref. Range 03/22/2017 08:13 03/22/2017 11:40 03/22/2017 17:03 03/22/2017 21:06 03/23/2017 07:44  Glucose-Capillary Latest Ref Range: 65 - 99 mg/dL 621266 (H) 308348 (H) 657177 (H) 93 163 (H)    Diabetes history: Type 2 Outpatient Diabetes medications: Lantus 25 units qday, Januvia 50mg  qday, Actos 45mg  qday  Current orders for Inpatient glycemic control: Novolog 5 units tid, Novolog 0-9 units tid, Novolog 0-5 units qhs, 45 mg Actos, Lantus 25 units qday *Solumedrol 40mg  q6h  Inpatient Diabetes Program Recommendations: Is Actos, based on potential for fluid retention, appropriate for this patient?   Susette RacerJulie Kida Digiulio, RN, BA, MHA, CDE Diabetes Coordinator Inpatient Glycemic Control Team [phone] (Team Pager) [phone] [facility] Office) 03/23/2017 10:22 AM

## 2017-03-23 NOTE — Progress Notes (Signed)
Patient admitted with dx of acute respiratory failure with hypoxia, AECOPD secondary to ongoing tobacco abuse, and found to be in rapid Afib.    Patient has hx of CKD Stage II, DM, HTN, Hypothyroidism, chronic back pain, and GERD.  Patient smokes 1/2 pack per day. Echo performed on 03/21/2017 patient was found to have EF of 60 - 65% with grade 1 diastolic dysfunction.    Patient lying in bed with oxygen at 3 liters via [place].  Daughter (Primary Caregiver) in the room with patient.  Patient asking her daughter if she can smoke.  Daughter was explaining to patient this was a non-smoking facility even in the parking lot.  Patient confused at this time.  Daughter informed this RN that patient is confused at times and at other times she is more oriented.    Provided patient and  with "Living Better with Heart Failure" packet. Briefly reviewed definition of heart failure and signs and symptoms of an exacerbation. Daughter stated that she usually has SOB or fluid in her feet especially in the summer time.    Explained to patient HF is a chronic illness which must be self assessed / self-managed along with the help of patient's Cardiologist and HF Clinic in order to keep one functioning in the home and to prevent ER visits.  ? *Reviewed importance of and reason behind checking weight daily in the AM, after using the bathroom, but before getting dressed. Patient informed this RN that she used to weigh herself every morning, but she just got out of the habit of doing this.  Daughter stated patient is wheelchair bound and is unable to stand for her to weigh patient.  She did state she can certainly assess her mother's SOB, swelling, and any other symptoms.   Reviewed the following information for when the cast is removed.  *Discussed when to call the Dr= weight gain of >3lb overnight or 5lb in a week,  *Discussed yellow zone= call MD: weight gain of >3lb overnight or 5lb in a week, increased swelling, increased SOB  when lying down, chest discomfort, dizziness, increased fatigue *Red Zone= call 911: struggle to breath, fainting or near fainting, significant chest pain  ? *Reviewed low sodium diet-provided handout of recommended and not recommended foods. Daughter informed this RN that her mother loves to eat potato chips.  These types of foods were discouraged.   ? *Instructed patient to take medications as prescribed for heart failure. Explained briefly why pt is on the medications (either make you feel better, live longer or keep you out of the hospital) and discussed monitoring and side effects.  ? *Exercise - Patient is wheelchair bound.    *Smoking Cessation - According to daughter patient smokes 1/2 pack per day.  Tips on quitting provided to daughter.   ? [facility] discussed. Heart Failure Clinic Appointment scheduled for April 03, 2017 at 12 noon. Daughter agreeable to patient being followed in Heart Failure Clinic. Directions provided.  ?   Army Meliaiane Martin Belling, RN, BSN, [facility]  [place]  [facility]  Cardiovascular & Pulmonary Nurse Navigator  Direct Line: [phone]  Department Phone #: [phone] Fax: [phone]  Email Address: [email or web address]  ?

## 2017-03-23 NOTE — Care Management (Addendum)
Patient qualified for home 02 today.   At present, she is on 3- 4 liters.  It is very likely she is going to require home 02 and may also benefit from a home nebulizer.  Daughter Addie is at bedside and agreeable for patient to return home with SN PT.  She is paid as the caregiver so no aide is needed. Current oxygen sats will expire 12/2  around 12 noon

## 2017-03-23 NOTE — Progress Notes (Signed)
Pt is anxious. Pts oxygen sat is 90 on 3L. MD notified for anxiety medication and to get parameter on oxygen level. MD states 90-92% on oxygen saturation is satisfactory. I will continue to assess.

## 2017-03-23 NOTE — Progress Notes (Signed)
ANTICOAGULATION CONSULT NOTE - Initial Consult  Pharmacy Consult for Heparin Indication: atrial fibrillation  Allergies  Allergen Reactions  . Codeine Itching  . Morphine And Related Itching  . Quinapril Other (See Comments)  . Sulfa Antibiotics Nausea Only    Patient Measurements: Height: 5\' 5"  (165.1 cm) Weight: 142 lb 9.6 oz (64.7 kg) IBW/kg (Calculated) : 57 Heparin Dosing Weight: 64.7  Vital Signs: Temp: 97.5 F (36.4 C) (11/29 1945) Temp Source: Oral (11/29 1945) BP: 109/54 (11/30 0519) Pulse Rate: 74 (11/30 0519)  Labs: Recent Labs    03/21/17 1237 03/22/17 0416 03/22/17 1147 03/23/17 0449  HGB 10.8* 9.2*  --  9.0*  HCT 33.9* 29.1*  --  28.9*  PLT 339 305  --  309  HEPARINUNFRC  --  0.64 0.47 0.99*  CREATININE 2.99* 2.72*  --  2.41*  TROPONINI 0.14* 0.08*  --   --     Estimated Creatinine Clearance: 15.6 mL/min (A) (by C-G formula based on SCr of 2.41 mg/dL (H)).   Medical History: Past Medical History:  Diagnosis Date  . Chronic low back pain without sciatica   . Diabetes mellitus without complication (HCC)   . Renal disorder     Medications:  Medications Prior to Admission  Medication Sig Dispense Refill Last Dose  . acetaminophen (TYLENOL) 500 MG tablet Take 1,000 mg by mouth every 8 (eight) hours as needed.   PRN at PRN  . alendronate (FOSAMAX) 10 MG tablet Take 1 tablet (10 mg total) by mouth once a week. (Patient taking differently: Take 10 mg by mouth daily. ) 4 tablet 0 03/21/2017 at AM  . amLODipine (NORVASC) 5 MG tablet Take 5 mg by mouth daily. for high blood pressure  3 03/21/2017 at AM  . aspirin EC 81 MG tablet Take 1 tablet by mouth daily.   03/21/2017 at AM  . cholecalciferol (VITAMIN D) 1000 units tablet Take 2,000 Units by mouth at bedtime.   03/20/2017 at 2000  . docusate sodium (COLACE) 100 MG capsule Take 100 mg by mouth 2 (two) times daily.   PRN at PRN  . doxycycline (VIBRAMYCIN) 100 MG capsule Take 1 capsule (100 mg total) by  mouth 2 (two) times daily for 7 days. 14 capsule 0 03/21/2017 at AM  . fluticasone (FLONASE) 50 MCG/ACT nasal spray Place 2 sprays into both nostrils daily.   03/21/2017 at AM  . furosemide (LASIX) 20 MG tablet Take 1 tablet (20 mg total) by mouth daily for 7 days. 7 tablet 0 03/21/2017 at AM  . gabapentin (NEURONTIN) 300 MG capsule Take 1 capsule by mouth daily.  3 03/21/2017 at AM  . Insulin Glargine (LANTUS SOLOSTAR) 100 UNIT/ML Solostar Pen Inject 25 Units into the skin daily.   03/21/2017 at N/A  . JANUVIA 50 MG tablet Take 1 tablet by mouth daily.   03/21/2017 at AM  . levothyroxine (SYNTHROID, LEVOTHROID) 75 MCG tablet Take 1 tablet by mouth daily.   03/21/2017 at AM  . Menthol-Zinc Oxide (CALMOSEPTINE) 0.44-20.6 % OINT Apply 1 application topically as needed (WITH EVERY DIAPER CHANGE).   PRN at PRN  . mirtazapine (REMERON SOL-TAB) 15 MG disintegrating tablet Take 1 tablet by mouth at bedtime.  3 03/20/2017 at 2000  . omeprazole (PRILOSEC) 20 MG capsule Take 1 capsule by mouth 2 (two) times daily.  3 03/21/2017 at AM  . pioglitazone (ACTOS) 45 MG tablet Take 1 tablet by mouth daily.   03/21/2017 at AM  . VENTOLIN HFA 108 (90  BASE) MCG/ACT inhaler Inhale 2 puffs into the lungs every 4 (four) hours as needed.   PRN at PRN  . HYDROcodone-acetaminophen (NORCO/VICODIN) 5-325 MG tablet Take 1 tablet by mouth every 4 (four) hours as needed for moderate pain. (Patient not taking: Reported on 03/16/2017) 16 tablet 0 Not Taking at D/C  . insulin aspart (NOVOLOG) 100 UNIT/ML injection Check finger stick before meals.  For glucose of 121 to 150 give 1 unit.  151 to 200 give 2 units. 201-250 give 3 units.  251-300 give 5 units.  301-350 give 7 units; 351-400 give 9 units (Patient not taking: Reported on 03/16/2017) 10 mL 0 Not Taking at D/C  . nicotine (NICODERM CQ - DOSED IN MG/24 HOURS) 21 mg/24hr patch Place 1 patch (21 mg total) onto the skin daily. (Patient not taking: Reported on 03/16/2017) 28 patch 0  Not Taking at D/C  . nystatin cream (MYCOSTATIN) Apply 1 g topically 4 (four) times daily as needed.  1 PRN at PRN  . predniSONE (DELTASONE) 50 MG tablet Take 1 tablet (50 mg total) by mouth daily with breakfast. (Patient not taking: Reported on 03/16/2017) 5 tablet 0 Not Taking at D/C   Scheduled:  . aspirin EC  81 mg Oral Daily  . azithromycin  250 mg Oral q1800  . budesonide (PULMICORT) nebulizer solution  0.5 mg Nebulization BID  . chlorhexidine  15 mL Mouth Rinse BID  . cholecalciferol  2,000 Units Oral QHS  . diltiazem  30 mg Oral Q6H  . docusate sodium  100 mg Oral BID  . feeding supplement (NEPRO CARB STEADY)  237 mL Oral BID BM  . fluticasone  2 spray Each Nare Daily  . gabapentin  300 mg Oral Daily  . guaiFENesin  600 mg Oral BID  . insulin aspart  0-5 Units Subcutaneous QHS  . insulin aspart  0-9 Units Subcutaneous TID WC  . insulin aspart  5 Units Subcutaneous TID WC  . insulin glargine  25 Units Subcutaneous Daily  . ipratropium-albuterol  3 mL Nebulization Q6H  . levothyroxine  75 mcg Oral QAC breakfast  . mouth rinse  15 mL Mouth Rinse q12n4p  . methylPREDNISolone (SOLU-MEDROL) injection  40 mg Intravenous Q6H  . mirtazapine  15 mg Oral QHS  . multivitamin with minerals  1 tablet Oral Daily  . nicotine  14 mg Transdermal Daily  . pantoprazole  40 mg Oral Daily  . pioglitazone  45 mg Oral Daily  . sodium chloride flush  3 mL Intravenous Q12H  . vitamin C  500 mg Oral Daily    Assessment: Pharmacy consulted to dose and monitor Heparin in this 83 year old female with atrial fibrillation . Goal of Therapy:  Heparin level 0.3-0.7 Monitor platelets by anticoagulation protocol: Yes   Plan:  HL= 0.47. Continue current rate. This is the second therapeutic level. Will check daily starting tomorrow AM.  11/30 AM heparin level 0.99. Decrease rate to 750 units/hr. Recheck heparin level in 8 hours.  Fulton ReekMatt Erion Weightman, PharmD, BCPS  03/23/17 5:50 AM

## 2017-03-24 ENCOUNTER — Inpatient Hospital Stay: Payer: Medicare Other

## 2017-03-24 LAB — CBC
HEMATOCRIT: 29.1 % — AB (ref 35.0–47.0)
Hemoglobin: 9.3 g/dL — ABNORMAL LOW (ref 12.0–16.0)
MCH: 25.7 pg — ABNORMAL LOW (ref 26.0–34.0)
MCHC: 31.9 g/dL — AB (ref 32.0–36.0)
MCV: 80.6 fL (ref 80.0–100.0)
Platelets: 324 10*3/uL (ref 150–440)
RBC: 3.6 MIL/uL — ABNORMAL LOW (ref 3.80–5.20)
RDW: 19.2 % — ABNORMAL HIGH (ref 11.5–14.5)
WBC: 8.7 10*3/uL (ref 3.6–11.0)

## 2017-03-24 LAB — BASIC METABOLIC PANEL
ANION GAP: 14 (ref 5–15)
BUN: 78 mg/dL — ABNORMAL HIGH (ref 6–20)
CHLORIDE: 103 mmol/L (ref 101–111)
CO2: 19 mmol/L — AB (ref 22–32)
CREATININE: 2.23 mg/dL — AB (ref 0.44–1.00)
Calcium: 8.2 mg/dL — ABNORMAL LOW (ref 8.9–10.3)
GFR calc non Af Amer: 19 mL/min — ABNORMAL LOW (ref >60)
GFR, EST AFRICAN AMERICAN: 22 mL/min — AB (ref >60)
Glucose, Bld: 67 mg/dL (ref 65–99)
POTASSIUM: 4 mmol/L (ref 3.5–5.1)
Sodium: 136 mmol/L (ref 135–145)

## 2017-03-24 LAB — GLUCOSE, CAPILLARY
GLUCOSE-CAPILLARY: 72 mg/dL (ref 65–99)
GLUCOSE-CAPILLARY: 180 mg/dL — AB (ref 65–99)
Glucose-Capillary: 158 mg/dL — ABNORMAL HIGH (ref 65–99)
Glucose-Capillary: 106 mg/dL — ABNORMAL HIGH (ref 65–99)

## 2017-03-24 LAB — MAGNESIUM: Magnesium: 2.1 mg/dL (ref 1.7–2.4)

## 2017-03-24 MED ORDER — FUROSEMIDE 10 MG/ML IJ SOLN
20.0000 mg | Freq: Two times a day (BID) | INTRAMUSCULAR | Status: DC
  Administered 2017-03-24 – 2017-03-25 (×3): 20 mg via INTRAVENOUS
  Filled 2017-03-24 (×3): qty 2

## 2017-03-24 NOTE — Progress Notes (Signed)
Patient ID: Tasha Grant, female   DOB: 03-18-1931, 83 y.o.   MRN: [id number]  Chart reviewed.  Stable labs.  Rate-controlled in atrial fibrillation in 70s.  Plan for rate control and Eliquis 2.5 mg bid. No changes.  Per Dr. Jari SportsmanArida's note, favor rate control over cardioversion.   We will see again on Monday.   Tasha Grant 03/24/2017 12:18 PM

## 2017-03-24 NOTE — Progress Notes (Signed)
Resumed 5l [place] after treatment. Sats 91% after neb.

## 2017-03-24 NOTE — Progress Notes (Signed)
[facility] - [place] [facility]   PATIENT NAME: Tasha LarocheMamie Grant    MR#:  [id number]  DATE OF BIRTH:  1930-08-09  SUBJECTIVE:  CHIEF COMPLAINT:   Chief Complaint  Patient presents with  . Shortness of Breath   Worsening shortness of breath and cough, on ventimask this am and then to O2 [place] 5 L. The patient has confusion and agitation. REVIEW OF SYSTEMS:  Review of Systems  Constitutional: Positive for malaise/fatigue. Negative for chills and fever.  HENT: Negative for sore throat.   Eyes: Negative for blurred vision and double vision.  Respiratory: Positive for cough, shortness of breath and wheezing. Negative for hemoptysis and stridor.   Cardiovascular: Negative for chest pain, palpitations, orthopnea and leg swelling.  Gastrointestinal: Negative for abdominal pain, blood in stool, diarrhea, melena, nausea and vomiting.  Genitourinary: Negative for dysuria, flank pain and hematuria.  Musculoskeletal: Negative for back pain and joint pain.  Neurological: Positive for weakness. Negative for dizziness, sensory change, focal weakness, seizures, loss of consciousness and headaches.  Endo/Heme/Allergies: Negative for polydipsia.  Psychiatric/Behavioral: Negative for depression. The patient is not nervous/anxious.     DRUG ALLERGIES:   Allergies  Allergen Reactions  . Codeine Itching  . Morphine And Related Itching  . Quinapril Other (See Comments)  . Sulfa Antibiotics Nausea Only   VITALS:  Blood pressure (!) 116/56, pulse 65, temperature 97.7 F (36.5 C), resp. rate 14, height 5\' 5"  (1.651 m), weight 165 lb (74.8 kg), SpO2 100 %. PHYSICAL EXAMINATION:  Physical Exam  Constitutional: She is oriented to person, place, and time and well-developed, well-nourished, and in no distress.  HENT:  Head: Normocephalic.  Mouth/Throat: Oropharynx is clear and moist.  Eyes: Conjunctivae and EOM are normal. Pupils are equal, round, and reactive to light. No scleral  icterus.  Neck: Normal range of motion. Neck supple. No JVD present. No tracheal deviation present.  Cardiovascular: Normal rate, regular rhythm and normal heart sounds. Exam reveals no gallop.  No murmur heard. Pulmonary/Chest: Effort normal. No respiratory distress. She has wheezes. She has no rales.  Crackles and rhonchi.  Abdominal: Soft. Bowel sounds are normal. She exhibits no distension. There is no tenderness. There is no rebound.  Musculoskeletal: Normal range of motion. She exhibits no edema or tenderness.  Neurological: She is alert and oriented to person, place, and time. No cranial nerve deficit.  Skin: No rash noted. No erythema.  Psychiatric: Affect normal.   LABORATORY PANEL:  Female CBC Recent Labs  Lab 03/24/17 0624  WBC 8.7  HGB 9.3*  HCT 29.1*  PLT 324   ------------------------------------------------------------------------------------------------------------------ Chemistries  Recent Labs  Lab 03/24/17 0624  NA 136  K 4.0  CL 103  CO2 19*  GLUCOSE 67  BUN 78*  CREATININE 2.23*  CALCIUM 8.2*  MG 2.1   RADIOLOGY:  Dg Chest Port 1 View  Result Date: 03/24/2017 CLINICAL DATA:  Shortness of breath, confusion EXAM: PORTABLE CHEST 1 VIEW COMPARISON:  03/21/2017 FINDINGS: Cardiomegaly. Diffuse interstitial prominence throughout the lungs compatible with fibrosis. Bilateral lower lobe airspace opacities are again noted, unchanged. Possible small effusions. IMPRESSION: Chronic fibrotic changes in the lungs. Continued bibasilar atelectasis or infiltrates with small effusions. Electronically Signed   By: Charlett NoseKevin  Dover M.D.   On: 03/24/2017 08:51   ASSESSMENT AND PLAN:   83 year old female with past medical history of COPD with ongoing tobacco abuse, chronic back pain, osteoporosis, hypertension, diabetes who presents to the hospital due to shortness of breath cough  and wheezing and noted to be in acute respiratory failure with hypoxia.  1. Acute respiratory  failure with hypoxia-secondary to COPD exacerbation. continue IV steroids, scheduled DuoNeb nebs, Pulmicort nebs and empiric antibiotics for community acquired pneumonia with ceftriaxone and Zithromax. She needs home oxygen prior to discharge. Pulmonary consult.  2. COPD exacerbation-as the cause of patient's worsening respiratory failure. Continue treatment as mentioned above.  3. Pneumonia, community acquired pneumonia.  continue IV ceftriaxone, Zithromax.  4. Acute kidney injury-secondary to dehydration. hold IV fluids due to worsening shortness of breath and wheezing, follow BUN and creatinine. start lasix 20 mg bid.   5. New-onset atrial fibrillation-patient noted to be in A. Fib w. RVR.  - This could be related to the patient's respiratory illness as mentioned. Echocardiogram: Systolic function was normal. The estimated ejection fraction was in the range of 60% to 65%.   Discontinued heparin drip and changed to Eliquis, continue Cardizem.   6. Elevated troponin-suspected to be seconded to supply demand ischemia from new-onset A. fib and hypoxemia.   7. Essential hypertension-continue current HTN meds.  8. Diabetes type 2 without complication-continue Lantus, sliding scale insulin, added novolog 5 units AC, on Actos.  9. GERD-continue Protonix.  10. Hypothyroidism-continue Synthroid.  11. Tobacco abuse.  Smoking cessation was counseled for 3-4 minutes.  Generalized weakness.  PT evaluation: HHPT  All the records are reviewed and case discussed with Care Management/Social Worker. Management plans discussed with the patient, her daughter and sister and they are in agreement.  CODE STATUS: Full Code  TOTAL TIME TAKING CARE OF THIS PATIENT: 40 minutes.   More than 50% of the time was spent in counseling/coordination of care: YES  POSSIBLE D/C IN 3 DAYS, DEPENDING ON CLINICAL CONDITION.   Shaune PollackQing Chekesha Grant M.D on 03/24/2017 at 12:56 PM  Between 7am to 6pm - Pager -  [phone]  After 6pm go to [email or web address] - Therapist, nutritionalpassword EPAS [facility]  [facility] [place] [facility]

## 2017-03-25 LAB — BASIC METABOLIC PANEL
Anion gap: 12 (ref 5–15)
BUN: 81 mg/dL — AB (ref 6–20)
CO2: 19 mmol/L — ABNORMAL LOW (ref 22–32)
CREATININE: 2.29 mg/dL — AB (ref 0.44–1.00)
Calcium: 7.9 mg/dL — ABNORMAL LOW (ref 8.9–10.3)
Chloride: 107 mmol/L (ref 101–111)
GFR calc Af Amer: 21 mL/min — ABNORMAL LOW (ref >60)
GFR, EST NON AFRICAN AMERICAN: 18 mL/min — AB (ref >60)
Glucose, Bld: 52 mg/dL — ABNORMAL LOW (ref 65–99)
Potassium: 4 mmol/L (ref 3.5–5.1)
Sodium: 138 mmol/L (ref 135–145)

## 2017-03-25 LAB — GLUCOSE, CAPILLARY
Glucose-Capillary: 78 mg/dL (ref 65–99)
Glucose-Capillary: 116 mg/dL — ABNORMAL HIGH (ref 65–99)
Glucose-Capillary: 99 mg/dL (ref 65–99)
Glucose-Capillary: 169 mg/dL — ABNORMAL HIGH (ref 65–99)

## 2017-03-25 MED ORDER — FUROSEMIDE 20 MG PO TABS
20.0000 mg | ORAL_TABLET | Freq: Once | ORAL | Status: AC
  Administered 2017-03-26: 20 mg via ORAL
  Filled 2017-03-25: qty 1

## 2017-03-25 NOTE — Progress Notes (Signed)
Patient moved from  room 249 to room 240. Son Mr Tasha Grant informed this morning of the room changed. Safety rounder in progress. Will continue to monitor.

## 2017-03-25 NOTE — Consult Note (Signed)
Pulmonary Critical Care  Initial Consult Note   Tasha Grant:[id number] DOB: [birth year] DOA: 03/21/2017  Referring physician: Dr Imogene Burn PCP: Hyman Hopes, MD   Chief Complaint: Shortness of Breath  HPI: Tasha Grant is a 83 y.o. female  With past history COPD ongoing tobacco use diabetes mellitus presented to hospital because crease and shortness of breath.  Shortness of breath going on for least a week prior to admission.  Patient had been seen in the emergency room given oral doxycycline and discharged.  Patient apparently did not improve according to the family and so they brought her back into the emergency room.  Patient is admitted with diagnosis of COPD exacerbation and pneumonitis.  She has a little bit confused so she is not a very good historian.  The patient according to the family that was present in the room which were the grandchildren is feeling a little bit better.  Her chest x-ray had shown some chronic fibrotic changes along with some basilar atelectasis versus infiltrates.  She has been started on therapy for COPD exacerbation as well as antibiotics for pneumonia.  Now clinically she is feeling better.  There is no fevers no chills.  No chest pain at this time she has no nausea vomiting.  Patient does have a little bit of a cough.  She states that she has not been seen previously by pulmonologist   Review of Systems:  Constitutional:  No weight loss, night sweats, Fevers, chills, +fatigue.  HEENT:  No headaches, nasal congestion, post nasal drip,  Cardio-vascular:  No chest pain, dizziness, palpitations  GI:  No heartburn, indigestion, abdominal pain, nausea, vomiting, diarrhea  Resp:  +shortness of breath no productive cough, No coughing up of blood. Skin:  no rash or lesions.  Musculoskeletal:  No joint pain or swelling.   Remainder ROS performed and is unremarkable other than noted in HPI  Past Medical History:  Diagnosis Date  . Chronic low back pain  without sciatica   . Diabetes mellitus without complication (HCC)   . Renal disorder    Past Surgical History:  Procedure Laterality Date  . JOINT REPLACEMENT    . KYPHOPLASTY N/A 02/23/2015   Procedure: KYPHOPLASTY T11;  Surgeon: Kennedy Bucker, MD;  Location: [facility] ORS;  Service: Orthopedics;  Laterality: N/A;  . TONSILLECTOMY     Social History:  reports that she has been smoking.  She has a 30.00 pack-year smoking history. she has never used smokeless tobacco. She reports that she does not drink alcohol or use drugs.  Allergies  Allergen Reactions  . Codeine Itching  . Morphine And Related Itching  . Quinapril Other (See Comments)  . Sulfa Antibiotics Nausea Only    Family History  Problem Relation Age of Onset  . Anxiety disorder Mother   . Leukemia Father     Prior to Admission medications   Medication Sig Start Date End Date Taking? Authorizing Provider  acetaminophen (TYLENOL) 500 MG tablet Take 1,000 mg by mouth every 8 (eight) hours as needed.   Yes [provider]  alendronate (FOSAMAX) 10 MG tablet Take 1 tablet (10 mg total) by mouth once a week. Patient taking differently: Take 10 mg by mouth daily.  02/23/15  Yes Wieting, Richard, MD  amLODipine (NORVASC) 5 MG tablet Take 5 mg by mouth daily. for high blood pressure 02/20/17  Yes [provider]  aspirin EC 81 MG tablet Take 1 tablet by mouth daily. 02/26/12  Yes [provider]  cholecalciferol (VITAMIN D) 1000 units tablet Take 2,000 Units by mouth at bedtime.   Yes [provider]  docusate sodium (COLACE) 100 MG capsule Take 100 mg by mouth 2 (two) times daily.   Yes [provider]  fluticasone (FLONASE) 50 MCG/ACT nasal spray Place 2 sprays into both nostrils daily. 01/24/15  Yes [provider]  furosemide (LASIX) 20 MG tablet Take 1 tablet (20 mg total) by mouth daily for 7 days. 03/16/17 03/23/17 Yes Phineas SemenGoodman, Graydon, MD  gabapentin (NEURONTIN) 300 MG capsule  Take 1 capsule by mouth daily. 12/31/16  Yes [provider]  Insulin Glargine (LANTUS SOLOSTAR) 100 UNIT/ML Solostar Pen Inject 25 Units into the skin daily. 04/01/14  Yes [provider]  JANUVIA 50 MG tablet Take 1 tablet by mouth daily. 02/17/15  Yes [provider]  levothyroxine (SYNTHROID, LEVOTHROID) 75 MCG tablet Take 1 tablet by mouth daily. 02/17/15  Yes [provider]  Menthol-Zinc Oxide (CALMOSEPTINE) 0.44-20.6 % OINT Apply 1 application topically as needed (WITH EVERY DIAPER CHANGE).   Yes [provider]  mirtazapine (REMERON SOL-TAB) 15 MG disintegrating tablet Take 1 tablet by mouth at bedtime. 01/23/17  Yes [provider]  omeprazole (PRILOSEC) 20 MG capsule Take 1 capsule by mouth 2 (two) times daily. 01/23/17  Yes [provider]  pioglitazone (ACTOS) 45 MG tablet Take 1 tablet by mouth daily. 02/17/15  Yes [provider]  VENTOLIN HFA 108 (90 BASE) MCG/ACT inhaler Inhale 2 puffs into the lungs every 4 (four) hours as needed. 02/17/15  Yes [provider]  HYDROcodone-acetaminophen (NORCO/VICODIN) 5-325 MG tablet Take 1 tablet by mouth every 4 (four) hours as needed for moderate pain. Patient not taking: Reported on 03/16/2017 02/04/17   Cuthriell, Delorise RoyalsJonathan D, PA-C  insulin aspart (NOVOLOG) 100 UNIT/ML injection Check finger stick before meals.  For glucose of 121 to 150 give 1 unit.  151 to 200 give 2 units. 201-250 give 3 units.  251-300 give 5 units.  301-350 give 7 units; 351-400 give 9 units Patient not taking: Reported on 03/16/2017 02/23/15   Alford HighlandWieting, Richard, MD  nicotine (NICODERM CQ - DOSED IN MG/24 HOURS) 21 mg/24hr patch Place 1 patch (21 mg total) onto the skin daily. Patient not taking: Reported on 03/16/2017 02/23/15   Alford HighlandWieting, Richard, MD  nystatin cream (MYCOSTATIN) Apply 1 g topically 4 (four) times daily as needed. 03/14/17   [provider]  predniSONE (DELTASONE) 50 MG tablet  Take 1 tablet (50 mg total) by mouth daily with breakfast. Patient not taking: Reported on 03/16/2017 02/04/17   Racheal PatchesCuthriell, Jonathan D, PA-C   Physical Exam: Vitals:   03/25/17 0734 03/25/17 0935 03/25/17 1926 03/25/17 1956  BP:  97/82 (!) 91/44   Pulse:  64 64   Resp:  18 17   Temp:  (!) 97.5 F (36.4 C)    TempSrc:      SpO2: 90% 91% 95% 96%  Weight:      Height:        Wt Readings from Last 3 Encounters:  03/25/17 81.2 kg (179 lb)  02/04/17 70.3 kg (155 lb)  02/19/15 63.9 kg (140 lb 14.4 oz)    General:  Appears calm and comfortable Eyes: PERRL, normal lids, irises & conjunctiva ENT: grossly normal hearing, lips & tongue Neck: no LAD, masses or thyromegaly Cardiovascular: RRR, no m/r/g. No LE edema. Respiratory:  Few scattered rhonchi are noted bilaterally. Normal respiratory effort. Abdomen: soft, nontender Skin: no rash or induration  seen on limited exam Musculoskeletal: grossly normal tone BUE/BLE Psychiatric: grossly normal mood and affect Neurologic: grossly non-focal.          Labs on Admission:  Basic Metabolic Panel: Recent Labs  Lab 03/21/17 1237 03/22/17 0416 03/23/17 0449 03/24/17 0624 03/25/17 0458  NA 138 134* 134* 136 138  K 4.3 4.6 4.1 4.0 4.0  CL 102 104 103 103 107  CO2 21* 19* 19* 19* 19*  GLUCOSE 113* 291* 116* 67 52*  BUN 69* 72* 76* 78* 81*  CREATININE 2.99* 2.72* 2.41* 2.23* 2.29*  CALCIUM 8.4* 7.7* 7.9* 8.2* 7.9*  MG  --  1.9  --  2.1  --    Liver Function Tests: No results for input(s): AST, ALT, ALKPHOS, BILITOT, PROT, ALBUMIN in the last 168 hours. No results for input(s): LIPASE, AMYLASE in the last 168 hours. No results for input(s): AMMONIA in the last 168 hours. CBC: Recent Labs  Lab 03/21/17 1237 03/22/17 0416 03/23/17 0449 03/24/17 0624  WBC 19.1* 11.0 14.6* 8.7  NEUTROABS 17.9*  --   --   --   HGB 10.8* 9.2* 9.0* 9.3*  HCT 33.9* 29.1* 28.9* 29.1*  MCV 80.2 80.4 81.1 80.6  PLT 339 305 309 324   Cardiac  Enzymes: Recent Labs  Lab 03/21/17 1237 03/22/17 0416  TROPONINI 0.14* 0.08*    BNP (last 3 results) Recent Labs    03/16/17 1807 03/21/17 1237  BNP 387.0* 2,116.0*    ProBNP (last 3 results) No results for input(s): PROBNP in the last 8760 hours.  CBG: Recent Labs  Lab 03/24/17 2113 03/25/17 0804 03/25/17 1132 03/25/17 1648 03/25/17 2030  GLUCAP 106* 78 116* 99 169*    Radiological Exams on Admission: Dg Chest Port 1 View  Result Date: 03/24/2017 CLINICAL DATA:  Shortness of breath, confusion EXAM: PORTABLE CHEST 1 VIEW COMPARISON:  03/21/2017 FINDINGS: Cardiomegaly. Diffuse interstitial prominence throughout the lungs compatible with fibrosis. Bilateral lower lobe airspace opacities are again noted, unchanged. Possible small effusions. IMPRESSION: Chronic fibrotic changes in the lungs. Continued bibasilar atelectasis or infiltrates with small effusions. Electronically Signed   By: Charlett NoseKevin  Dover M.D.   On: 03/24/2017 08:51    EKG: Independently reviewed.  Assessment/Plan Active Problems:   Acute respiratory failure with hypoxia (HCC)   1.  acute on chronic respiratory failure with hypoxia  she was apparently not on oxygen prior to admission  She will need to have her oxygen checked prior to discharge  Would suspect that she would benefit from home O2  2. COPD with exacerbation  continue with current management Steroids nebulizers and therapy should be continued  3. Pneumonitis  unclear if this is infectious in nature Nine would complete her antibiotic therapy Will need follow-up x-ray after she is discharged    Code Status:  DNR (must indicate code status--if unknown or must be presumed, indicate so)  Family Communication:  Grandchildren were present in the room (indicate person spoken with, if applicable, with phone number if by telephone) Disposition Plan:  home (indicate anticipated LOS)  Time spent:  70 min    I have personally obtained a  history, examined the patient, evaluated laboratory and imaging results, formulated the assessment and plan and placed orders.  The Patient requires high complexity decision making for assessment and support.    Yevonne PaxSaadat A Minette Manders, MD [facility] Center[facility]CCP Pulmonary Critical Care Medicine Sleep Medicine

## 2017-03-25 NOTE — Progress Notes (Addendum)
[facility] - [place] [facility]   PATIENT NAME: Tasha LarocheMamie Grant    MR#:  [id number]  DATE OF BIRTH:  1931-01-03  SUBJECTIVE:  CHIEF COMPLAINT:   Chief Complaint  Patient presents with  . Shortness of Breath   still shortness of breath and cough, on O2 [place] 5 L. The patient is still confused. REVIEW OF SYSTEMS:  Review of Systems  Unable to perform ROS: Mental status change    DRUG ALLERGIES:   Allergies  Allergen Reactions  . Codeine Itching  . Morphine And Related Itching  . Quinapril Other (See Comments)  . Sulfa Antibiotics Nausea Only   VITALS:  Blood pressure 97/82, pulse 64, temperature (!) 97.5 F (36.4 C), resp. rate 18, height 5\' 5"  (1.651 m), weight 179 lb (81.2 kg), SpO2 91 %. PHYSICAL EXAMINATION:  Physical Exam  Constitutional: She is well-developed, well-nourished, and in no distress.  HENT:  Head: Normocephalic.  Mouth/Throat: Oropharynx is clear and moist.  Eyes: Conjunctivae and EOM are normal. Pupils are equal, round, and reactive to light. No scleral icterus.  Neck: Normal range of motion. Neck supple. No JVD present. No tracheal deviation present.  Cardiovascular: Normal rate, regular rhythm and normal heart sounds. Exam reveals no gallop.  No murmur heard. Pulmonary/Chest: Effort normal. No respiratory distress. She has no wheezes. She has no rales.  Crackles and rhonchi.  Abdominal: Soft. Bowel sounds are normal. She exhibits no distension. There is no tenderness. There is no rebound.  Musculoskeletal: Normal range of motion. She exhibits no edema or tenderness.  Neurological: No cranial nerve deficit.  The patient is confused, unable to exam.  Skin: No rash noted. No erythema.   LABORATORY PANEL:  Female CBC Recent Labs  Lab 03/24/17 0624  WBC 8.7  HGB 9.3*  HCT 29.1*  PLT 324   ------------------------------------------------------------------------------------------------------------------ Chemistries  Recent Labs   Lab 03/24/17 0624 03/25/17 0458  NA 136 138  K 4.0 4.0  CL 103 107  CO2 19* 19*  GLUCOSE 67 52*  BUN 78* 81*  CREATININE 2.23* 2.29*  CALCIUM 8.2* 7.9*  MG 2.1  --    RADIOLOGY:  No results found. ASSESSMENT AND PLAN:   83 year old female with past medical history of COPD with ongoing tobacco abuse, chronic back pain, osteoporosis, hypertension, diabetes who presents to the hospital due to shortness of breath cough and wheezing and noted to be in acute respiratory failure with hypoxia.  1. Acute respiratory failure with hypoxia-secondary to COPD exacerbation. continue IV steroids, scheduled DuoNeb nebs, Pulmicort nebs and empiric antibiotics for community acquired pneumonia with ceftriaxone and Zithromax. She needs home oxygen prior to discharge. Pulmonary consult.  2. COPD exacerbation-as the cause of patient's worsening respiratory failure. Continue treatment as mentioned above.  3. Pneumonia, community acquired pneumonia.  continue IV ceftriaxone, Zithromax.  4. Acute kidney injury on CKD stage 3.-secondary to dehydration. hold IV fluids due to worsening shortness of breath and wheezing, follow BUN and creatinine. started lasix 20 mg bid.   5. New-onset atrial fibrillation-patient noted to be in A. Fib w. RVR.  - This could be related to the patient's respiratory illness as mentioned. Echocardiogram: Systolic function was normal. The estimated ejection fraction was in the range of 60% to 65%.   Discontinued heparin drip and changed to Eliquis, continue Cardizem.   6. Elevated troponin-suspected to be seconded to supply demand ischemia from new-onset A. fib and hypoxemia.   7. Essential hypertension-continue current HTN meds, hold if BP  is low.  8. Diabetes type 2 without complication-continue Lantus, sliding scale insulin, added novolog 5 units AC, on Actos.  9. GERD-continue Protonix.  10. Hypothyroidism-continue Synthroid.  11. Tobacco abuse.  Smoking  cessation was counseled for 3-4 minutes.  Generalized weakness.  PT evaluation: HHPT  All the records are reviewed and case discussed with Care Management/Social Worker. Management plans discussed with the patient, her daughter and sister and they are in agreement.  CODE STATUS: DNR  TOTAL TIME TAKING CARE OF THIS PATIENT: 37 minutes.   More than 50% of the time was spent in counseling/coordination of care: YES  POSSIBLE D/C IN 3 DAYS, DEPENDING ON CLINICAL CONDITION.   Shaune PollackQing Lennix Rotundo M.D on 03/25/2017 at 12:20 PM  Between 7am to 6pm - Pager - [phone]  After 6pm go to [email or web address] - Therapist, nutritionalpassword EPAS [facility]  [facility]  Hospitalists

## 2017-03-25 NOTE — Progress Notes (Signed)
Advanced Care Plan.  Purpose of Encounter: CODE STATUS. Parties in Attendance: The patient, her son and me. Patient's Decisional Capacity: No. Medical Story: 83 year old female with past medical history of COPD with ongoing tobacco abuse, chronic back pain, osteoporosis, hypertension, diabetes who presents to the hospital due to shortness of breath cough and wheezing and noted to be in acute respiratory failure with hypoxia. She is being treated acute respiratory failure with hypoxia-secondary to COPD exacerbation and PNA.  She also has a new onset A. fib and renal failure.  I discussed the patient called his status with his son.  His son is to become the POA for her.  He said the patient mentioned that she does not want to be resuscitated and intubated. He agrees to change to DNR.  Plan:  Code Status: DNR. Time spent discussing advance care planning: 20 minutes.

## 2017-03-26 DIAGNOSIS — R0603 Acute respiratory distress: Secondary | ICD-10-CM

## 2017-03-26 DIAGNOSIS — Z515 Encounter for palliative care: Secondary | ICD-10-CM

## 2017-03-26 DIAGNOSIS — Z66 Do not resuscitate: Secondary | ICD-10-CM

## 2017-03-26 LAB — BASIC METABOLIC PANEL
ANION GAP: 10 (ref 5–15)
BUN: 79 mg/dL — ABNORMAL HIGH (ref 6–20)
CO2: 22 mmol/L (ref 22–32)
Calcium: 8 mg/dL — ABNORMAL LOW (ref 8.9–10.3)
Chloride: 103 mmol/L (ref 101–111)
Creatinine, Ser: 2.58 mg/dL — ABNORMAL HIGH (ref 0.44–1.00)
GFR calc Af Amer: 19 mL/min — ABNORMAL LOW (ref >60)
GFR calc non Af Amer: 16 mL/min — ABNORMAL LOW (ref >60)
Glucose, Bld: 221 mg/dL — ABNORMAL HIGH (ref 65–99)
POTASSIUM: 4.3 mmol/L (ref 3.5–5.1)
SODIUM: 135 mmol/L (ref 135–145)

## 2017-03-26 LAB — GLUCOSE, CAPILLARY
GLUCOSE-CAPILLARY: 219 mg/dL — AB (ref 65–99)
GLUCOSE-CAPILLARY: 293 mg/dL — AB (ref 65–99)
GLUCOSE-CAPILLARY: 68 mg/dL (ref 65–99)
Glucose-Capillary: 98 mg/dL (ref 65–99)
Glucose-Capillary: 111 mg/dL — ABNORMAL HIGH (ref 65–99)

## 2017-03-26 MED ORDER — MORPHINE SULFATE (CONCENTRATE) 10 MG/0.5ML PO SOLN
5.0000 mg | ORAL | Status: DC | PRN
  Filled 2017-03-26: qty 1

## 2017-03-26 NOTE — Progress Notes (Signed)
Pt's CBG was 68 mg/dL.  Sitter now encouraging patient to drink orange juice and eat peanut butter and graham crackers.

## 2017-03-26 NOTE — Consult Note (Signed)
Consultation Note Date: 03/26/2017   Patient Name: Tasha SprinklesMamie L Grant  DOB: 1931-03-02  MRN: [id number]  Age / Sex: 83 y.o., female  PCP: Hyman HopesBurns, Harriett P, MD Referring Physician: Shaune Pollackhen, Qing, MD  Reason for Consultation: Establishing goals of care and Psychosocial/spiritual support  HPI/Patient Profile: 83 y.o. female  admitted on 03/21/2017 with known history of COPD with ongoing tobacco abuse, diabetes, chronic back pain, osteoporosis, hypothyroidism, GERD who presents to the hospital due to shortness of breath.   Patient develop shortness of breath about 5-6 days ago and was in the ER diagnosed with a pneumonia discharged on some oral doxycycline and also Lasix.   As per the daughter patient has not improved and she has significant shortness of breath and cough with minimal exertion. She's had no appetite with the past few days and therefore was brought to the ER for further evaluation.   Patient was noted to be in acute respiratory failure with hypoxia secondary to COPD exacerbation from pneumonia. She was also noted to be in acute kidney injury and also with a mildly elevated troponin. She was also noted to be in new onset atrial fibrillation. Hospitalist services were contacted further treatment and evaluation. Patient denies any chest pains, nausea, vomiting, abdominal pain, paroxysmal nocturnal dyspnea, orthopnea, lower extremity edema or any other associated symptoms.  Family report continued physical, functional and cognitive decline over the past year.  Family face advanced directive decisions and anticipatory care needs  Clinical Assessment and Goals of Care:   This NP Lorinda CreedMary Larach reviewed medical records, received report from team, assessed the patient and then meet at the patient's bedside along with her daughter Tasha Grant # [phone] ( patient lives with this daughter), two sons, Tasha Grant and  Tasha Grant  to discuss diagnosis, prognosis, GOC, EOL wishes disposition and options.  A detailed discussion was had today regarding advanced directives.  Concepts specific to code status, artifical feeding and hydration, continued IV antibiotics and rehospitalization was had.  The difference between a aggressive medical intervention path  and a palliative comfort care path for this patient at this time was had.  Values and goals of care important to patient and family were attempted to be elicited.  MOST form completed to reflect comfort  Concept of Hospice and Palliative Care were discussed  Natural trajectory and expectations at EOL were discussed.  Questions and concerns addressed.   Family encouraged to call with questions or concerns.  PMT will continue to support holistically.   NEXT OF KIN- no documented HPOA, children act as a unit    SUMMARY OF RECOMMENDATIONS    Code Status/Advance Care Planning:  DNR    Symptom Management:   Pain/Dyspnea: Roxanol 5 mg po/sl every 3 hrs prn  Palliative Prophylaxis:   Aspiration, Bowel Regimen, Delirium Protocol, Frequent Pain Assessment and Oral Care  Additional Recommendations (Limitations, Scope, Preferences):  Full Comfort Care  Psycho-social/Spiritual:   Desire for further Chaplaincy support:no  Additional Recommendations: Education on Hospice  Prognosis:   < 3 months  Discharge  Planning: Home with Hospice      Primary Diagnoses: Present on Admission: . Acute respiratory failure with hypoxia (HCC)   I have reviewed the medical record, interviewed the patient and family, and examined the patient. The following aspects are pertinent.  Past Medical History:  Diagnosis Date  . Chronic low back pain without sciatica   . Diabetes mellitus without complication (HCC)   . Renal disorder    Social History   Socioeconomic History  . Marital status: Married    Spouse name: None  . Number of children: None  . Years of  education: None  . Highest education level: None  Social Needs  . Financial resource strain: None  . Food insecurity - worry: None  . Food insecurity - inability: None  . Transportation needs - medical: None  . Transportation needs - non-medical: None  Occupational History  . None  Tobacco Use  . Smoking status: Current Every Day Smoker    Packs/day: 0.50    Years: 60.00    Pack years: 30.00  . Smokeless tobacco: Never Used  Substance and Sexual Activity  . Alcohol use: No  . Drug use: No  . Sexual activity: None  Other Topics Concern  . None  Social History Narrative  . None   Family History  Problem Relation Age of Onset  . Anxiety disorder Mother   . Leukemia Father    Scheduled Meds: . apixaban  2.5 mg Oral BID  . budesonide (PULMICORT) nebulizer solution  0.5 mg Nebulization BID  . chlorhexidine  15 mL Mouth Rinse BID  . cholecalciferol  2,000 Units Oral QHS  . diltiazem  120 mg Oral Daily  . docusate sodium  100 mg Oral BID  . feeding supplement (NEPRO CARB STEADY)  237 mL Oral BID BM  . fluticasone  2 spray Each Nare Daily  . gabapentin  300 mg Oral Daily  . guaiFENesin  600 mg Oral BID  . insulin aspart  0-5 Units Subcutaneous QHS  . insulin aspart  0-9 Units Subcutaneous TID WC  . insulin aspart  5 Units Subcutaneous TID WC  . insulin glargine  25 Units Subcutaneous Daily  . ipratropium-albuterol  3 mL Nebulization Q6H  . levothyroxine  75 mcg Oral QAC breakfast  . magic mouthwash  2 mL Oral TID   And  . lidocaine  2 mL Mouth/Throat TID  . methylPREDNISolone (SOLU-MEDROL) injection  40 mg Intravenous Q6H  . mirtazapine  15 mg Oral QHS  . multivitamin with minerals  1 tablet Oral Daily  . nicotine  14 mg Transdermal Daily  . pantoprazole  40 mg Oral Daily  . sodium chloride flush  3 mL Intravenous Q12H  . vitamin C  500 mg Oral Daily   Continuous Infusions: PRN Meds:.acetaminophen **OR** acetaminophen, ALPRAZolam, guaiFENesin-dextromethorphan,  ondansetron **OR** ondansetron (ZOFRAN) IV Medications Prior to Admission:  Prior to Admission medications   Medication Sig Start Date End Date Taking? Authorizing Provider  acetaminophen (TYLENOL) 500 MG tablet Take 1,000 mg by mouth every 8 (eight) hours as needed.   Yes [provider]  alendronate (FOSAMAX) 10 MG tablet Take 1 tablet (10 mg total) by mouth once a week. Patient taking differently: Take 10 mg by mouth daily.  02/23/15  Yes Wieting, Richard, MD  amLODipine (NORVASC) 5 MG tablet Take 5 mg by mouth daily. for high blood pressure 02/20/17  Yes [provider]  aspirin EC 81 MG tablet Take 1 tablet by mouth daily. 02/26/12  Yes [provider]  cholecalciferol (VITAMIN D) 1000 units tablet Take 2,000 Units by mouth at bedtime.   Yes [provider]  docusate sodium (COLACE) 100 MG capsule Take 100 mg by mouth 2 (two) times daily.   Yes [provider]  fluticasone (FLONASE) 50 MCG/ACT nasal spray Place 2 sprays into both nostrils daily. 01/24/15  Yes [provider]  furosemide (LASIX) 20 MG tablet Take 1 tablet (20 mg total) by mouth daily for 7 days. 03/16/17 03/23/17 Yes Phineas Semen, MD  gabapentin (NEURONTIN) 300 MG capsule Take 1 capsule by mouth daily. 12/31/16  Yes [provider]  Insulin Glargine (LANTUS SOLOSTAR) 100 UNIT/ML Solostar Pen Inject 25 Units into the skin daily. 04/01/14  Yes [provider]  JANUVIA 50 MG tablet Take 1 tablet by mouth daily. 02/17/15  Yes [provider]  levothyroxine (SYNTHROID, LEVOTHROID) 75 MCG tablet Take 1 tablet by mouth daily. 02/17/15  Yes [provider]  Menthol-Zinc Oxide (CALMOSEPTINE) 0.44-20.6 % OINT Apply 1 application topically as needed (WITH EVERY DIAPER CHANGE).   Yes [provider]  mirtazapine (REMERON SOL-TAB) 15 MG disintegrating tablet Take 1 tablet by mouth at bedtime. 01/23/17  Yes [provider]  omeprazole  (PRILOSEC) 20 MG capsule Take 1 capsule by mouth 2 (two) times daily. 01/23/17  Yes [provider]  pioglitazone (ACTOS) 45 MG tablet Take 1 tablet by mouth daily. 02/17/15  Yes [provider]  VENTOLIN HFA 108 (90 BASE) MCG/ACT inhaler Inhale 2 puffs into the lungs every 4 (four) hours as needed. 02/17/15  Yes [provider]  HYDROcodone-acetaminophen (NORCO/VICODIN) 5-325 MG tablet Take 1 tablet by mouth every 4 (four) hours as needed for moderate pain. Patient not taking: Reported on 03/16/2017 02/04/17   Cuthriell, Delorise Royals, PA-C  insulin aspart (NOVOLOG) 100 UNIT/ML injection Check finger stick before meals.  For glucose of 121 to 150 give 1 unit.  151 to 200 give 2 units. 201-250 give 3 units.  251-300 give 5 units.  301-350 give 7 units; 351-400 give 9 units Patient not taking: Reported on 03/16/2017 02/23/15   Alford Highland, MD  nicotine (NICODERM CQ - DOSED IN MG/24 HOURS) 21 mg/24hr patch Place 1 patch (21 mg total) onto the skin daily. Patient not taking: Reported on 03/16/2017 02/23/15   Alford Highland, MD  nystatin cream (MYCOSTATIN) Apply 1 g topically 4 (four) times daily as needed. 03/14/17   [provider]  predniSONE (DELTASONE) 50 MG tablet Take 1 tablet (50 mg total) by mouth daily with breakfast. Patient not taking: Reported on 03/16/2017 02/04/17   Cuthriell, Delorise Royals, PA-C   Allergies  Allergen Reactions  . Codeine Itching  . Morphine And Related Itching  . Quinapril Other (See Comments)  . Sulfa Antibiotics Nausea Only   Review of Systems  Unable to perform ROS: Dementia    Physical Exam  Constitutional: She appears well-developed.  Pulmonary/Chest: Tachypnea noted. She has decreased breath sounds.  Abdominal: Soft. Bowel sounds are normal.  Neurological: She is alert.  oriented only to person  Skin: Skin is warm and dry.  Multiple areas of bruising over extremities  Psychiatric: She is agitated. She is inattentive.     Vital Signs: BP (!) 111/52 (BP Location: Right Arm)   Pulse 78   Temp (!) 97.5 F (36.4 C) (Oral)   Resp 19   Ht 5\' 5"  (1.651 m)   Wt 80.3 kg (177 lb)   SpO2 92%   BMI  29.45 kg/m  Pain Assessment: No/denies pain   Pain Score: 0-No pain   SpO2: SpO2: 92 % O2 Device:SpO2: 92 % O2 Flow Rate: .O2 Flow Rate (L/min): 5 L/min  IO: Intake/output summary:   Intake/Output Summary (Last 24 hours) at 03/26/2017 1159 Last data filed at 03/26/2017 0941 Gross per 24 hour  Intake 360 ml  Output -  Net 360 ml    LBM: Last BM Date: 03/21/17 Baseline Weight: Weight: 70.3 kg (155 lb) Most recent weight: Weight: 80.3 kg (177 lb)     Palliative Assessment/Data: 30 % at best   Discussed with Dr Imogene Burnhen  Time In: 1300 Time Out: 1415 Time Total: 75 min Greater than 50%  of this time was spent counseling and coordinating care related to the above assessment and plan.  Signed by: Lorinda CreedMary Larach, NP   Please contact Palliative Medicine Team phone at [phone] for questions and concerns.  For individual provider: See Loretha StaplerAmion

## 2017-03-26 NOTE — Progress Notes (Signed)
New referral for [facility] Searing[facility]ann Greene following a Palliative Medicine consult. Writer spoke on the phone with patient's daughter [employer], plan is to meet in the morning around 9 am. Patient will need oxygen in place prior to discharge. Oxygen ordered for delivery tomorrow. Full note to follow after meeting on 12/4. Patient information faxed to referral. CMRN Ermalene SearingNann Greene updated. Dayna BarkerKaren Robertson RN, BSN, [facility], [facility] L[facility] [phone] c

## 2017-03-26 NOTE — Progress Notes (Addendum)
[facility] [place] [facility]   PATIENT NAME: Tasha LarocheMamie Grant    MR#:  [id number]  DATE OF BIRTH:  06-25-1930  SUBJECTIVE:  CHIEF COMPLAINT:   Chief Complaint  Patient presents with  . Shortness of Breath   still shortness of breath and cough, on O2 [place] 5 L. The patient is confused. REVIEW OF SYSTEMS:  Review of Systems  Unable to perform ROS: Mental status change    DRUG ALLERGIES:   Allergies  Allergen Reactions  . Codeine Itching  . Morphine And Related Itching  . Quinapril Other (See Comments)  . Sulfa Antibiotics Nausea Only   VITALS:  Blood pressure (!) 111/52, pulse 78, temperature (!) 97.5 F (36.4 C), temperature source Oral, resp. rate 19, height 5\' 5"  (1.651 m), weight 177 lb (80.3 kg), SpO2 92 %. PHYSICAL EXAMINATION:  Physical Exam  Constitutional: She is oriented to person, place, and time and well-developed, well-nourished, and in no distress.  HENT:  Head: Normocephalic.  Mouth/Throat: Oropharynx is clear and moist.  Eyes: Conjunctivae and EOM are normal. Pupils are equal, round, and reactive to light. No scleral icterus.  Neck: Normal range of motion. Neck supple. No JVD present. No tracheal deviation present.  Cardiovascular: Normal rate, regular rhythm and normal heart sounds. Exam reveals no gallop.  No murmur heard. Pulmonary/Chest: Effort normal. No respiratory distress. She has no wheezes. She has no rales.   rhonchi.  Abdominal: Soft. Bowel sounds are normal. She exhibits no distension. There is no tenderness. There is no rebound.  Musculoskeletal: Normal range of motion. She exhibits no edema or tenderness.  Neurological: She is oriented to person, place, and time. No cranial nerve deficit.  Skin: No rash noted. No erythema.  Psychiatric:  Confused.   LABORATORY PANEL:  Female CBC Recent Labs  Lab 03/24/17 0624  WBC 8.7  HGB 9.3*  HCT 29.1*  PLT 324    ------------------------------------------------------------------------------------------------------------------ Chemistries  Recent Labs  Lab 03/24/17 0624  03/26/17 0434  NA 136   < > 135  K 4.0   < > 4.3  CL 103   < > 103  CO2 19*   < > 22  GLUCOSE 67   < > 221*  BUN 78*   < > 79*  CREATININE 2.23*   < > 2.58*  CALCIUM 8.2*   < > 8.0*  MG 2.1  --   --    < > = values in this interval not displayed.   RADIOLOGY:  No results found. ASSESSMENT AND PLAN:   83 year old female with past medical history of COPD with ongoing tobacco abuse, chronic back pain, osteoporosis, hypertension, diabetes who presents to the hospital due to shortness of breath cough and wheezing and noted to be in acute respiratory failure with hypoxia.  1. Acute respiratory failure with hypoxia-secondary to COPD exacerbation. continue IV steroids, scheduled DuoNeb nebs, Pulmicort nebs and empiric antibiotics for community acquired pneumonia with ceftriaxone and Zithromax. She needs home oxygen prior to discharge. Pulmonary consult, Dr. Welton FlakesKhan suggested continue current treatment.  2. COPD exacerbation-as the cause of patient's worsening respiratory failure. Continue treatment as mentioned above.  3. Pneumonia, community acquired pneumonia.  continue IV ceftriaxone, Zithromax.  4. Acute kidney injury on CKD stage 3.-secondary to dehydration. hold IV fluids due to worsening shortness of breath and wheezing, follow BUN and creatinine. hold lasix again.   5. New-onset atrial fibrillation-patient noted to be in A. Fib w. RVR.  - This could be related  to the patient's respiratory illness as mentioned. Echocardiogram: Systolic function was normal. The estimated ejection fraction was in the range of 60% to 65%.   Discontinued heparin drip and changed to Eliquis, continue Cardizem.   6. Elevated troponin-suspected to be seconded to supply demand ischemia from new-onset A. fib and hypoxemia.   7.  Essential hypertension-continue current HTN meds, hold if BP is low.  8. Diabetes type 2 without complication-continue Lantus, sliding scale insulin, added novolog 5 units AC, on Actos.  9. GERD-continue Protonix.  10. Hypothyroidism-continue Synthroid.  11. Tobacco abuse.  Smoking cessation was counseled for 3-4 minutes.  Generalized weakness.  PT evaluation: HHPT  Poor prognosis, palliative care consult suggests hospice care at home. Family agree.  I discussed with palliative care staff, Corrie DandyMary.  All the records are reviewed and case discussed with Care Management/Social Worker. Management plans discussed with the patient, her daughter and they are in agreement.  CODE STATUS: DNR  TOTAL TIME TAKING CARE OF THIS PATIENT: 38 minutes.   More than 50% of the time was spent in counseling/coordination of care: YES  POSSIBLE D/C IN 2-3 DAYS, DEPENDING ON CLINICAL CONDITION.   Shaune PollackQing Jujuan Dugo M.D on 03/26/2017 at 12:57 PM  Between 7am to 6pm - Pager - [phone]  After 6pm go to [email or web address] - Therapist, nutritionalpassword EPAS [facility]  [facility]

## 2017-03-26 NOTE — Care Management (Signed)
Palliative has consulted and family wish for patient to return home with hospice services.  Spoke with daughter Coralee Rudddie and she confirms.  [facility] Notified agency.  Oxygen is acute.  Daughter verbalized concerns that hospice service would adversely affect patient's current medicaid cap services. Informed the services will not be affecting.

## 2017-03-26 NOTE — Progress Notes (Signed)
Chaplain received an order to visit with pt in room 240. Chaplain provided information for an advanced directive. Pt was not in her right mind. Family will contact oncall Chaplain tomorrow to try again.    03/26/17 0930  Clinical Encounter Type  Visited With Patient;Patient and family together  Visit Type Initial  Referral From Nurse  Consult/Referral To Chaplain  Spiritual Encounters  Spiritual Needs Other (Comment)

## 2017-03-27 DIAGNOSIS — Z66 Do not resuscitate: Secondary | ICD-10-CM

## 2017-03-27 DIAGNOSIS — Z515 Encounter for palliative care: Secondary | ICD-10-CM

## 2017-03-27 DIAGNOSIS — J189 Pneumonia, unspecified organism: Secondary | ICD-10-CM

## 2017-03-27 DIAGNOSIS — R0603 Acute respiratory distress: Secondary | ICD-10-CM

## 2017-03-27 LAB — GLUCOSE, CAPILLARY
GLUCOSE-CAPILLARY: 155 mg/dL — AB (ref 65–99)
GLUCOSE-CAPILLARY: 73 mg/dL (ref 65–99)
Glucose-Capillary: 179 mg/dL — ABNORMAL HIGH (ref 65–99)
Glucose-Capillary: 218 mg/dL — ABNORMAL HIGH (ref 65–99)
Glucose-Capillary: 50 mg/dL — ABNORMAL LOW (ref 65–99)

## 2017-03-27 MED ORDER — DEXTROSE 50 % IV SOLN
25.0000 mL | Freq: Once | INTRAVENOUS | Status: AC
  Administered 2017-03-27: 25 mL via INTRAVENOUS
  Filled 2017-03-27: qty 50

## 2017-03-27 MED ORDER — GUAIFENESIN-DM 100-10 MG/5ML PO SYRP
5.0000 mL | ORAL_SOLUTION | ORAL | Status: DC | PRN
  Administered 2017-03-27: 5 mL via ORAL
  Filled 2017-03-27: qty 5

## 2017-03-27 MED ORDER — INSULIN GLARGINE 100 UNIT/ML ~~LOC~~ SOLN
10.0000 [IU] | Freq: Every day | SUBCUTANEOUS | Status: DC
  Administered 2017-03-27: 10 [IU] via SUBCUTANEOUS
  Filled 2017-03-27 (×2): qty 0.1

## 2017-03-27 NOTE — Progress Notes (Signed)
Visit made to new referral for [facility]. Patient is an 83 year old woman with a known history of COPD with on going tobacco abuse, diabetes, chronic back pain, osteoporosis, hypothyroidism and GERD who presented to the [facility] ED on 11/28 with shortness of breath. She has previously seen in the ED on 11/23 and was discharged with oral abt for  Treatment of PNA.  She did not improve and also developed increased dyspnea and cough. She was brought back to the ED and admitted for treatment of acute respiratory failure related to acute COPD exacerbatio and PNA. She was also noted to have acute kidney injury. She has continued to decline despite medical treatment with poor appetite and elevated kidney functions. Palliative Medicine was consulted for goals of care and met with the patient and family on 12/3 and they have chosen to focus on her comfort at home with the support of hospice services. Patient seen lying in bed, head covered with a blanket, did Recruitment consultant when spoken to. Daughter [name] at bedside, Probation officer initiated education regarding hospice services, philosophy and team approach to care with understanding voiced. Reviewed DME needs,  oxygen and nebulizer machine have been ordered and will be delivered late this afternoon per family request. [name] (Missy) would like her mother to remain in the hospital one more day, with discharge planned for tomorrow. Dr. Bridgett Larsson in during the visit and is in agreement.  Patient more alert and interactive when her daughter interacted with her. She is nonambulatory at baseline and will require EMS transport. Hospital care team updated. Patient information faxed to referral. Will continue to follow through final disposition. Flo Shanks RN, BSN, [place] [facility] [place], [facility] [phone] c

## 2017-03-27 NOTE — Progress Notes (Signed)
Nutrition Follow Up Note   DOCUMENTATION CODES:   Obesity unspecified, Not applicable  INTERVENTION:   Nepro Shake po BID, each supplement provides 425 kcal and 19 grams protein  Magic cup TID with meals, each supplement provides 290 kcal and 9 grams of protein  NUTRITION DIAGNOSIS:   Inadequate oral intake related to acute illness as evidenced by per patient/family report.  GOAL:   Patient will meet greater than or equal to 90% of their needs  MONITOR:   PO intake, Supplement acceptance, Labs, Weight trends, I & O's  ASSESSMENT:   83 year old female with past medical history of COPD with ongoing tobacco abuse, chronic back pain, osteoporosis, hypertension, diabetes who presents to the hospital due to shortness of breath cough and wheezing and noted to be in acute respiratory failure with hypoxia.   Pt continues to do poorly; eating <25% of meals. Per palliative care note, pt to discharge home with hospice. Continue to offer supplements for comfort. Pt noted to have 28lb weight gain since admit.   Medications reviewed and include: colace, insulin, synthroid, mirtazapine, nicotine, protonix   Labs reviewed: BUN 79(H), creat 2.58(H), Ca 8.0(L) Hgb 9.3(L), Hct 29.1(L)  Diet Order:  Diet heart healthy/carb modified Room service appropriate? Yes; Fluid consistency: Thin Diet - low sodium heart healthy  EDUCATION NEEDS:   Education needs have been addressed(with family member)  Skin:  Reviewed RN Assessment  Last BM:  12/2  Height:   Ht Readings from Last 1 Encounters:  03/21/17 5\' 5"  (1.651 m)    Weight:   Wt Readings from Last 1 Encounters:  03/27/17 170 lb (77.1 kg)    Ideal Body Weight:  56.8 kg  BMI:  Body mass index is 28.29 kg/m.  Estimated Nutritional Needs:   Kcal:  1500-1700kcal/day   Protein:  65-77g/day   Fluid:  per MD  Tasha Holidayasey Tanishka Drolet MS, RD, LDN Pager #[phone]- [phone] After Hours Pager: [phone]

## 2017-03-27 NOTE — Care Management Important Message (Signed)
Important Message  Patient Details  Name: Tasha SprinklesMamie L Grant MRN: [id number] Date of Birth: [birth year]   Medicare Important Message Given:  Yes    Eber HongGreene, Julena Barbour R, RN 03/27/2017, 9:40 AM

## 2017-03-27 NOTE — Discharge Instructions (Signed)
Hospice care

## 2017-03-27 NOTE — Progress Notes (Signed)
[facility] - [place] at [facility]   PATIENT NAME: Tasha LarocheMamie Grant    MR#:  [id number]  DATE OF BIRTH:  October 01, 1930  SUBJECTIVE:  CHIEF COMPLAINT:   Chief Complaint  Patient presents with  . Shortness of Breath   sThe patient is confused, on O2 [place] 5 L.  REVIEW OF SYSTEMS:  Review of Systems  Unable to perform ROS: Mental status change    DRUG ALLERGIES:   Allergies  Allergen Reactions  . Codeine Itching  . Morphine And Related Itching  . Quinapril Other (See Comments)  . Sulfa Antibiotics Nausea Only   VITALS:  Blood pressure (!) 113/53, pulse 68, temperature 98.1 F (36.7 C), temperature source Oral, resp. rate 18, height 5\' 5"  (1.651 m), weight 170 lb (77.1 kg), SpO2 96 %. PHYSICAL EXAMINATION:  Physical Exam  Constitutional: She is well-developed, well-nourished, and in no distress.  HENT:  Head: Normocephalic.  Mouth/Throat: Oropharynx is clear and moist.  Eyes: Conjunctivae and EOM are normal. Pupils are equal, round, and reactive to light. No scleral icterus.  Neck: Normal range of motion. Neck supple. No JVD present. No tracheal deviation present.  Cardiovascular: Normal rate, regular rhythm and normal heart sounds. Exam reveals no gallop.  No murmur heard. Pulmonary/Chest: Effort normal. No respiratory distress. She has no wheezes. She has no rales.  Abdominal: Soft. Bowel sounds are normal. She exhibits no distension. There is no tenderness. There is no rebound.  Musculoskeletal: Normal range of motion. She exhibits no edema or tenderness.  Neurological:  Unable to exam.  Skin: No rash noted. No erythema.  Psychiatric:  Confused.   LABORATORY PANEL:  Female CBC Recent Labs  Lab 03/24/17 0624  WBC 8.7  HGB 9.3*  HCT 29.1*  PLT 324   ------------------------------------------------------------------------------------------------------------------ Chemistries  Recent Labs  Lab 03/24/17 0624  03/26/17 0434  NA 136   < > 135  K  4.0   < > 4.3  CL 103   < > 103  CO2 19*   < > 22  GLUCOSE 67   < > 221*  BUN 78*   < > 79*  CREATININE 2.23*   < > 2.58*  CALCIUM 8.2*   < > 8.0*  MG 2.1  --   --    < > = values in this interval not displayed.   RADIOLOGY:  No results found. ASSESSMENT AND PLAN:   83 year old female with past medical history of COPD with ongoing tobacco abuse, chronic back pain, osteoporosis, hypertension, diabetes who presents to the hospital due to shortness of breath cough and wheezing and noted to be in acute respiratory failure with hypoxia.  1. Acute respiratory failure with hypoxia-secondary to COPD exacerbation. continue IV steroids, scheduled DuoNeb nebs, Pulmicort nebs and empiric antibiotics for community acquired pneumonia with ceftriaxone and Zithromax. She needs home oxygen prior to discharge. Pulmonary consult, Dr. Welton FlakesKhan suggested continue current treatment.  2. COPD exacerbation-as the cause of patient's worsening respiratory failure. Continue treatment as mentioned above.  3. Pneumonia, community acquired pneumonia.  continue IV ceftriaxone, Zithromax.  4. Acute kidney injury on CKD stage 3.-secondary to dehydration. hold IV fluids due to worsening shortness of breath and wheezing, follow BUN and creatinine. hold lasix again.   5. New-onset atrial fibrillation-patient noted to be in A. Fib w. RVR.  - This could be related to the patient's respiratory illness as mentioned. Echocardiogram: Systolic function was normal. The estimated ejection fraction was in the range of 60% to 65%.  Discontinued heparin drip and changed to Eliquis, continue Cardizem.   6. Elevated troponin-suspected to be seconded to supply demand ischemia from new-onset A. fib and hypoxemia.   7. Essential hypertension-continue current HTN meds, hold if BP is low.  8. Diabetes type 2 without complication-continue Lantus, sliding scale insulin, added novolog 5 units AC, on Actos.  9. GERD-continue  Protonix.  10. Hypothyroidism-continue Synthroid.  11. Tobacco abuse.  Smoking cessation was counseled for 3-4 minutes.  Generalized weakness.  PT evaluation: HHPT  Poor prognosis, palliative care consult suggests hospice care at home. Family agree.  I discussed with palliative care staff, Corrie DandyMary and Clydie BraunKaren.  All the records are reviewed and case discussed with Care Management/Social Worker. Management plans discussed with the patient, her daughter and they are in agreement.  CODE STATUS: DNR  TOTAL TIME TAKING CARE OF THIS PATIENT: 35 minutes.   More than 50% of the time was spent in counseling/coordination of care: YES  POSSIBLE D/C IN 1 DAYS, DEPENDING ON CLINICAL CONDITION.   Shaune PollackQing Ricardo Kayes M.D on 03/27/2017 at 3:15 PM  Between 7am to 6pm - Pager - [phone]  After 6pm go to [email or web address] - Therapist, nutritionalpassword [email or web address] [email or web address]  [facility] [place] [facility]

## 2017-03-27 NOTE — Plan of Care (Signed)
  Progressing Safety: Ability to remain free from injury will improve 03/27/2017 1114 - Progressing by Rigoberto NoelMorales, Keigan Tafoya Y, RN Note safety sitter, low bed, family at bedside, yellow socks

## 2017-03-28 LAB — GLUCOSE, CAPILLARY
GLUCOSE-CAPILLARY: 100 mg/dL — AB (ref 65–99)
GLUCOSE-CAPILLARY: 101 mg/dL — AB (ref 65–99)
GLUCOSE-CAPILLARY: 98 mg/dL (ref 65–99)
Glucose-Capillary: 30 mg/dL — CL (ref 65–99)
Glucose-Capillary: 95 mg/dL (ref 65–99)

## 2017-03-28 MED ORDER — DEXTROSE 50 % IV SOLN
1.0000 | Freq: Once | INTRAVENOUS | Status: DC
Start: 2017-03-28 — End: 2017-03-28

## 2017-03-28 MED ORDER — ALPRAZOLAM 0.25 MG PO TABS: 0.2500 mg | ORAL_TABLET | Freq: Three times a day (TID) | ORAL | 0 refills | Status: DC | PRN

## 2017-03-28 MED ORDER — DILTIAZEM HCL ER COATED BEADS 120 MG PO CP24: 120.0000 mg | ORAL_CAPSULE | Freq: Every day | ORAL | 1 refills | Status: AC

## 2017-03-28 MED ORDER — PREDNISONE 50 MG PO TABS: 50.0000 mg | ORAL_TABLET | Freq: Every day | ORAL | Status: DC

## 2017-03-28 MED ORDER — DEXTROSE 50 % IV SOLN
25.0000 mL | Freq: Once | INTRAVENOUS | Status: AC
  Administered 2017-03-28: 25 mL via INTRAVENOUS

## 2017-03-28 MED ORDER — APIXABAN 2.5 MG PO TABS: 2.5000 mg | ORAL_TABLET | Freq: Two times a day (BID) | ORAL | 0 refills | Status: AC

## 2017-03-28 MED ORDER — GUAIFENESIN-DM 100-10 MG/5ML PO SYRP: 5.0000 mL | ORAL_SOLUTION | ORAL | 0 refills | Status: AC | PRN

## 2017-03-28 MED ORDER — IPRATROPIUM-ALBUTEROL 0.5-2.5 (3) MG/3ML IN SOLN: 3.0000 mL | Freq: Four times a day (QID) | RESPIRATORY_TRACT | 0 refills | Status: AC

## 2017-03-28 MED ORDER — PREDNISONE 10 MG PO TABS: ORAL_TABLET | ORAL | 0 refills | Status: AC

## 2017-03-28 NOTE — Progress Notes (Signed)
Follow up visit made to new referral for Hospice services at home. Patient seen, appeared to be sleeping. No signs of discomfort noted. Daughter [name] came and Clinical research associatewriter reviewed hospice services. DME is in place. Medications reviewed with attending physician Dr. Imogene Burnhen. Plan is for discharge home this afternoon via EMS. Signed DNR in place in discharge packet. Hospital care team updated. Thank you. Dayna BarkerKaren robertson RN, BSN, [facility], hospital Liaison [phone]

## 2017-03-28 NOTE — Progress Notes (Addendum)
Hypoglycemic Event  CBG: 30 at 0835  Treatment: D50 IV 25 mL  Symptoms: None  Follow-up CBG: ZOXW:[id number]:0855 CBG Result:101  Possible Reasons for Event: Inadequate meal intake  Comments/MD notified: Dr. Imogene Burnhen notified    Rigoberto NoelErica Y Asher Babilonia

## 2017-03-28 NOTE — Progress Notes (Signed)
Tasha Grant to be D/C'd Home with hospice per MD order.  Discussed prescriptions and follow up appointments with Tasha Grant( daughter). Prescriptions given to patient, medication list explained in detail.Daugher verbalized understanding.  Allergies as of 03/28/2017      Reactions   Codeine Itching   Morphine And Related Itching   Quinapril Other (See Comments)   Sulfa Antibiotics Nausea Only      Medication List    STOP taking these medications   alendronate 10 MG tablet Commonly known as:  FOSAMAX   amLODipine 5 MG tablet Commonly known as:  NORVASC   cholecalciferol 1000 units tablet Commonly known as:  VITAMIN D   doxycycline 100 MG capsule Commonly known as:  VIBRAMYCIN   furosemide 20 MG tablet Commonly known as:  LASIX   insulin aspart 100 UNIT/ML injection Commonly known as:  novoLOG   JANUVIA 50 MG tablet Generic drug:  sitaGLIPtin   LANTUS SOLOSTAR 100 UNIT/ML Solostar Pen Generic drug:  Insulin Glargine   pioglitazone 45 MG tablet Commonly known as:  ACTOS     TAKE these medications   acetaminophen 500 MG tablet Commonly known as:  TYLENOL Take 1,000 mg by mouth every 8 (eight) hours as needed.   apixaban 2.5 MG Tabs tablet Commonly known as:  ELIQUIS Take 1 tablet (2.5 mg total) by mouth 2 (two) times daily.   aspirin EC 81 MG tablet Take 1 tablet by mouth daily.   CALMOSEPTINE 0.44-20.6 % Oint Generic drug:  Menthol-Zinc Oxide Apply 1 application topically as needed (WITH EVERY DIAPER CHANGE).   diltiazem 120 MG 24 hr capsule Commonly known as:  CARDIZEM CD Take 1 capsule (120 mg total) by mouth daily.   docusate sodium 100 MG capsule Commonly known as:  COLACE Take 100 mg by mouth 2 (two) times daily.   fluticasone 50 MCG/ACT nasal spray Commonly known as:  FLONASE Place 2 sprays into both nostrils daily.   gabapentin 300 MG capsule Commonly known as:  NEURONTIN Take 1 capsule by mouth daily.   guaiFENesin-dextromethorphan 100-10 MG/5ML  syrup Commonly known as:  ROBITUSSIN DM Take 5 mLs by mouth every 4 (four) hours as needed for cough.   HYDROcodone-acetaminophen 5-325 MG tablet Commonly known as:  NORCO/VICODIN Take 1 tablet by mouth every 4 (four) hours as needed for moderate pain.   ipratropium-albuterol 0.5-2.5 (3) MG/3ML Soln Commonly known as:  DUONEB Take 3 mLs by nebulization every 6 (six) hours.   levothyroxine 75 MCG tablet Commonly known as:  SYNTHROID, LEVOTHROID Take 1 tablet by mouth daily.   mirtazapine 15 MG disintegrating tablet Commonly known as:  REMERON SOL-TAB Take 1 tablet by mouth at bedtime.   nicotine 21 mg/24hr patch Commonly known as:  NICODERM CQ - dosed in mg/24 hours Place 1 patch (21 mg total) onto the skin daily.   nystatin cream Commonly known as:  MYCOSTATIN Apply 1 g topically 4 (four) times daily as needed.   omeprazole 20 MG capsule Commonly known as:  PRILOSEC Take 1 capsule by mouth 2 (two) times daily.   predniSONE 10 MG tablet Commonly known as:  DELTASONE 50 mg po daily for 1 days,40 mg po daily for 1 days,30 mg po daily for 1 days, 20 mg po daily for 1 days, 10 mg po daily for 1 days. What changed:    medication strength  how much to take  how to take this  when to take this  additional instructions   VENTOLIN HFA 108 (90  Base) MCG/ACT inhaler Generic drug:  albuterol Inhale 2 puffs into the lungs every 4 (four) hours as needed.       Vitals:   03/28/17 0938 03/28/17 1315  BP: 124/70 121/62  Pulse: (!) 112 73  Resp: 18 18  Temp:    SpO2: 90% 90%    Skin clean, dry and intact without evidence of skin break down, no evidence of skin tears noted. IV catheter discontinued intact. Site without signs and symptoms of complications. Dressing and pressure applied. Pt denies pain at this time. No complaints noted.  An After Visit Summary was printed and given to the patient. Patient escorted via [facility].  Tasha NoelErica Y Kenidi Grant

## 2017-03-28 NOTE — Progress Notes (Signed)
Rt attempted to placed nasal cannula on pt, pt pulling cannula off and covering nose to prevent placement. Pt started to dig her fingernails into this RT arm, at this point any further attempts were stopped.

## 2017-03-28 NOTE — Discharge Summary (Signed)
[facility] - [place] at [facility]   PATIENT NAME: Tasha Grant    MR#:  [id number]  DATE OF BIRTH:  01/16/1931  DATE OF ADMISSION:  03/21/2017   ADMITTING PHYSICIAN: Houston Siren, MD  DATE OF DISCHARGE: 03/28/2017 PRIMARY CARE PHYSICIAN: Hyman Hopes, MD   ADMISSION DIAGNOSIS:  Acute respiratory failure with hypoxia (HCC) [J96.01] AKI (acute kidney injury) (HCC) [N17.9] Demand ischemia of myocardium (HCC) [I24.8] Atrial fibrillation, unspecified type (HCC) [I48.91] Community acquired pneumonia, unspecified laterality [J18.9] DISCHARGE DIAGNOSIS:  Active Problems:   Acute respiratory failure with hypoxia (HCC)   Community acquired pneumonia   Acute respiratory distress   Palliative care by specialist   DNR (do not resuscitate)  SECONDARY DIAGNOSIS:   Past Medical History:  Diagnosis Date  . Chronic low back pain without sciatica   . Diabetes mellitus without complication (HCC)   . Renal disorder    HOSPITAL COURSE:   83 year old female with past medical history of COPD with ongoing tobacco abuse, chronic back pain, osteoporosis, hypertension, diabetes who presents to the hospital due to shortness of breath cough and wheezing and noted to be in acute respiratory failure with hypoxia.  1. Acute respiratory failure with hypoxia-secondary to COPD exacerbation. Discontinue IV steroids, change to taper prednisone, continue DuoNeb nebs, Pulmicort nebs and completed antibiotics for community acquired pneumonia with ceftriaxone and Zithromax. She needs home oxygen 2-3 L at home. Pulmonary consult, Dr. Welton Flakes suggested continue current treatment.  2. COPD exacerbation-as the cause of patient's worsening respiratory failure. Continue treatment as mentioned above.  3. Pneumonia, community acquired pneumonia.  completed IV ceftriaxone, Zithromax.  4. Acute kidney injury on CKD stage 3.-secondary to dehydration. hold IV fluids due to worsening  shortness of breath and wheezing, follow BUN and hold lasix due to worsening renal function.   5.New-onset atrial fibrillation-patient noted to be in A. Fibw. RVR.  -This could be related to the patient's respiratory illness as mentioned. Echocardiogram: Systolic function was normal. The estimated ejection fraction was in the range of 60% to 65%.  Discontinued heparin drip and changed to Eliquis, continue Cardizem.   6. Elevated troponin-suspected to be seconded to supply demand ischemia from new-onset A. fib and hypoxemia.   7. Essential hypertension-continue current HTN meds, hold if BP is low.  8. Diabetes type 2 without complication- she has been treated with Lantus, sliding scale insulin, added novolog 5 units AC, on Actos.  Hypoglycemia.  Due to poor oral intake.  The patient has had episodes of hypoglycemia for the past 2 days.  Lantus is discontinued.  Stop home medication Actos and Januvia.  9. GERD-continue Protonix.  10. Hypothyroidism-continue Synthroid.  11. Tobacco abuse.  Smoking cessation was counseled for 3-4 minutes.  Generalized weakness.  PT evaluation: HHPT  Poor prognosis, palliative care consult suggests hospice care at home. Family agree.  I discussed with palliative care staff, Corrie Dandy and Clydie Braun. DISCHARGE CONDITIONS:  Poor prognosis, discharged to home with hospice care today. CONSULTS OBTAINED:  Treatment Team:  Yevonne Pax, MD DRUG ALLERGIES:   Allergies  Allergen Reactions  . Codeine Itching  . Morphine And Related Itching  . Quinapril Other (See Comments)  . Sulfa Antibiotics Nausea Only   DISCHARGE MEDICATIONS:   Allergies as of 03/28/2017      Reactions   Codeine Itching   Morphine And Related Itching   Quinapril Other (See Comments)   Sulfa Antibiotics Nausea Only      Medication List  STOP taking these medications   alendronate 10 MG tablet Commonly known as:  FOSAMAX   amLODipine 5 MG tablet Commonly known as:   NORVASC   cholecalciferol 1000 units tablet Commonly known as:  VITAMIN D   doxycycline 100 MG capsule Commonly known as:  VIBRAMYCIN   furosemide 20 MG tablet Commonly known as:  LASIX   insulin aspart 100 UNIT/ML injection Commonly known as:  novoLOG   JANUVIA 50 MG tablet Generic drug:  sitaGLIPtin   LANTUS SOLOSTAR 100 UNIT/ML Solostar Pen Generic drug:  Insulin Glargine   pioglitazone 45 MG tablet Commonly known as:  ACTOS     TAKE these medications   acetaminophen 500 MG tablet Commonly known as:  TYLENOL Take 1,000 mg by mouth every 8 (eight) hours as needed.   apixaban 2.5 MG Tabs tablet Commonly known as:  ELIQUIS Take 1 tablet (2.5 mg total) by mouth 2 (two) times daily.   aspirin EC 81 MG tablet Take 1 tablet by mouth daily.   CALMOSEPTINE 0.44-20.6 % Oint Generic drug:  Menthol-Zinc Oxide Apply 1 application topically as needed (WITH EVERY DIAPER CHANGE).   diltiazem 120 MG 24 hr capsule Commonly known as:  CARDIZEM CD Take 1 capsule (120 mg total) by mouth daily.   docusate sodium 100 MG capsule Commonly known as:  COLACE Take 100 mg by mouth 2 (two) times daily.   fluticasone 50 MCG/ACT nasal spray Commonly known as:  FLONASE Place 2 sprays into both nostrils daily.   gabapentin 300 MG capsule Commonly known as:  NEURONTIN Take 1 capsule by mouth daily.   guaiFENesin-dextromethorphan 100-10 MG/5ML syrup Commonly known as:  ROBITUSSIN DM Take 5 mLs by mouth every 4 (four) hours as needed for cough.   HYDROcodone-acetaminophen 5-325 MG tablet Commonly known as:  NORCO/VICODIN Take 1 tablet by mouth every 4 (four) hours as needed for moderate pain.   ipratropium-albuterol 0.5-2.5 (3) MG/3ML Soln Commonly known as:  DUONEB Take 3 mLs by nebulization every 6 (six) hours.   levothyroxine 75 MCG tablet Commonly known as:  SYNTHROID, LEVOTHROID Take 1 tablet by mouth daily.   mirtazapine 15 MG disintegrating tablet Commonly known as:   REMERON SOL-TAB Take 1 tablet by mouth at bedtime.   nicotine 21 mg/24hr patch Commonly known as:  NICODERM CQ - dosed in mg/24 hours Place 1 patch (21 mg total) onto the skin daily.   nystatin cream Commonly known as:  MYCOSTATIN Apply 1 g topically 4 (four) times daily as needed.   omeprazole 20 MG capsule Commonly known as:  PRILOSEC Take 1 capsule by mouth 2 (two) times daily.   predniSONE 10 MG tablet Commonly known as:  DELTASONE 50 mg po daily for 1 days,40 mg po daily for 1 days,30 mg po daily for 1 days, 20 mg po daily for 1 days, 10 mg po daily for 1 days. What changed:    medication strength  how much to take  how to take this  when to take this  additional instructions   VENTOLIN HFA 108 (90 Base) MCG/ACT inhaler Generic drug:  albuterol Inhale 2 puffs into the lungs every 4 (four) hours as needed.        DISCHARGE INSTRUCTIONS:  See AVS. If you experience worsening of your admission symptoms, develop shortness of breath, life threatening emergency, suicidal or homicidal thoughts you must seek medical attention immediately by calling 911 or calling your MD immediately  if symptoms less severe.  You Must read complete instructions/literature  along with all the possible adverse reactions/side effects for all the Medicines you take and that have been prescribed to you. Take any new Medicines after you have completely understood and accpet all the possible adverse reactions/side effects.   Please note  You were cared for by a hospitalist during your hospital stay. If you have any questions about your discharge medications or the care you received while you were in the hospital after you are discharged, you can call the unit and asked to speak with the hospitalist on call if the hospitalist that took care of you is not available. Once you are discharged, your primary care physician will handle any further medical issues. Please note that NO REFILLS for any  discharge medications will be authorized once you are discharged, as it is imperative that you return to your primary care physician (or establish a relationship with a primary care physician if you do not have one) for your aftercare needs so that they can reassess your need for medications and monitor your lab values.    On the day of Discharge:  VITAL SIGNS:  Blood pressure 121/62, pulse 73, temperature 97.7 F (36.5 C), temperature source Oral, resp. rate 18, height 5\' 5"  (1.651 m), weight 169 lb (76.7 kg), SpO2 90 %. PHYSICAL EXAMINATION:  GENERAL:  83 y.o.-year-old patient lying in the bed with no acute distress.  EYES: Pupils equal, round, reactive to light and accommodation. No scleral icterus. Extraocular muscles intact.  HEENT: Head atraumatic, normocephalic. Oropharynx and nasopharynx clear.  NECK:  Supple, no jugular venous distention. No thyroid enlargement, no tenderness.  LUNGS: Normal breath sounds bilaterally, no wheezing, rales,rhonchi or crepitation. No use of accessory muscles of respiration.  CARDIOVASCULAR: S1, S2 normal. No murmurs, rubs, or gallops.  ABDOMEN: Soft, non-tender, non-distended. Bowel sounds present. No organomegaly or mass.  EXTREMITIES: No pedal edema, cyanosis, or clubbing.  NEUROLOGIC:unable to exam.  PSYCHIATRIC: The patient is confused. SKIN: No obvious rash, lesion, or ulcer.  DATA REVIEW:   CBC Recent Labs  Lab 03/24/17 0624  WBC 8.7  HGB 9.3*  HCT 29.1*  PLT 324    Chemistries  Recent Labs  Lab 03/24/17 0624  03/26/17 0434  NA 136   < > 135  K 4.0   < > 4.3  CL 103   < > 103  CO2 19*   < > 22  GLUCOSE 67   < > 221*  BUN 78*   < > 79*  CREATININE 2.23*   < > 2.58*  CALCIUM 8.2*   < > 8.0*  MG 2.1  --   --    < > = values in this interval not displayed.     Microbiology Results  Results for orders placed or performed during the hospital encounter of 02/18/15  Surgical pcr screen     Status: None   Collection Time:  02/22/15  2:27 PM  Result Value Ref Range Status   MRSA, PCR NEGATIVE NEGATIVE Final   Staphylococcus aureus NEGATIVE NEGATIVE Final    Comment:        The Xpert SA Assay (FDA approved for NASAL specimens in patients over 921 years of age), is one component of a comprehensive surveillance program.  Test performance has been validated by [facility] for patients greater than or equal to 81 year old. It is not intended to diagnose infection nor to guide or monitor treatment.     RADIOLOGY:  No results found.   Management plans discussed  with the patient, her daughter and they are in agreement.  CODE STATUS: DNR   TOTAL TIME TAKING CARE OF THIS PATIENT: 35 minutes.    Shaune Pollack M.D on 03/28/2017 at 1:45 PM  Between 7am to 6pm - Pager - [phone]  After 6pm go to [email or web address] - Social research officer, government  [facility] [place] [facility]  Office  [phone]  CC: Primary care physician; Hyman Hopes, MD   Note: This dictation was prepared with Dragon dictation along with smaller phrase technology. Any transcriptional errors that result from this process are unintentional.

## 2017-04-03 ENCOUNTER — Ambulatory Visit: Payer: Medicare Other | Admitting: Family

## 2017-04-24 DEATH — deceased
